# Patient Record
Sex: Female | Born: 1940
Health system: Southern US, Community
[De-identification: ages and names within clinical notes are randomized; demographics above are authoritative.]

## PROBLEM LIST (undated history)

## (undated) DIAGNOSIS — R319 Hematuria, unspecified: Secondary | ICD-10-CM

## (undated) DIAGNOSIS — M8580 Other specified disorders of bone density and structure, unspecified site: Secondary | ICD-10-CM

## (undated) DIAGNOSIS — E785 Hyperlipidemia, unspecified: Secondary | ICD-10-CM

## (undated) DIAGNOSIS — C4490 Unspecified malignant neoplasm of skin, unspecified: Secondary | ICD-10-CM

## (undated) HISTORY — PX: COLONOSCOPY: SHX174

## (undated) HISTORY — PX: ABDOMINOPLASTY: SUR9

---

## 1978-01-14 HISTORY — PX: BUNIONECTOMY: SHX129

## 2002-01-14 HISTORY — PX: AUGMENTATION MAMMAPLASTY: SUR837

## 2007-01-15 HISTORY — PX: FRACTURE SURGERY: SHX138

## 2008-01-20 ENCOUNTER — Ambulatory Visit: Payer: Self-pay | Admitting: Orthopedic Surgery

## 2008-09-13 ENCOUNTER — Ambulatory Visit: Payer: Self-pay | Admitting: Unknown Physician Specialty

## 2008-09-22 ENCOUNTER — Ambulatory Visit: Payer: Self-pay | Admitting: Urology

## 2008-09-29 ENCOUNTER — Ambulatory Visit: Payer: Self-pay | Admitting: Urology

## 2009-01-02 ENCOUNTER — Ambulatory Visit: Payer: Self-pay | Admitting: Podiatry

## 2009-04-04 ENCOUNTER — Ambulatory Visit: Payer: Self-pay | Admitting: Urology

## 2009-10-02 ENCOUNTER — Ambulatory Visit: Payer: Self-pay | Admitting: Urology

## 2010-10-08 ENCOUNTER — Ambulatory Visit: Payer: Self-pay | Admitting: Urology

## 2011-03-06 ENCOUNTER — Ambulatory Visit: Payer: Self-pay | Admitting: Urology

## 2012-02-02 IMAGING — US US RENAL KIDNEY
1 series · 17 of 25 positions shown · non-contrast
Comparison: none

REASON FOR EXAM: angiomyolipoma
COMMENTS:

[Series 1: us renal kidney · 17 of 33 slices shown]
[im 1/33]
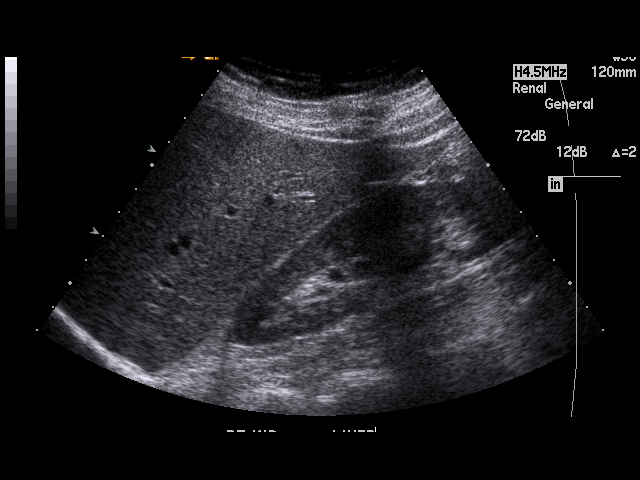
[im 3/33]
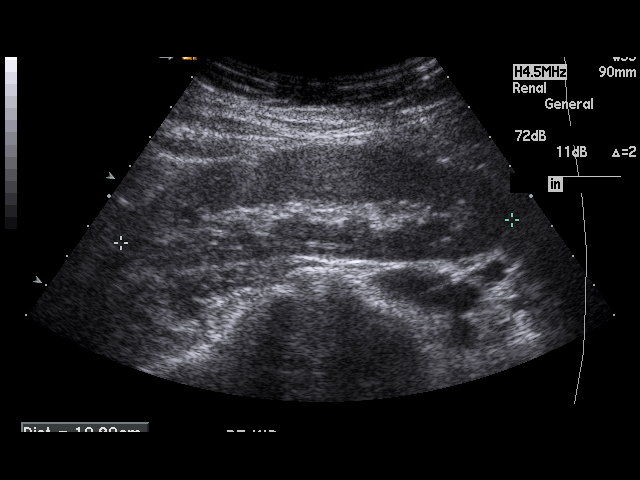
[im 5/33]
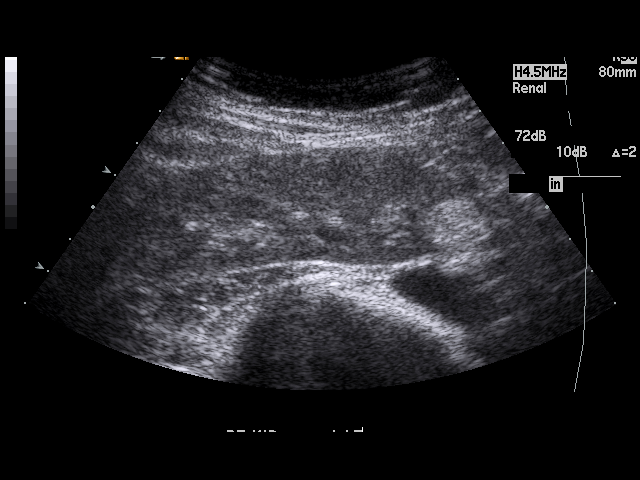
[im 7/33]
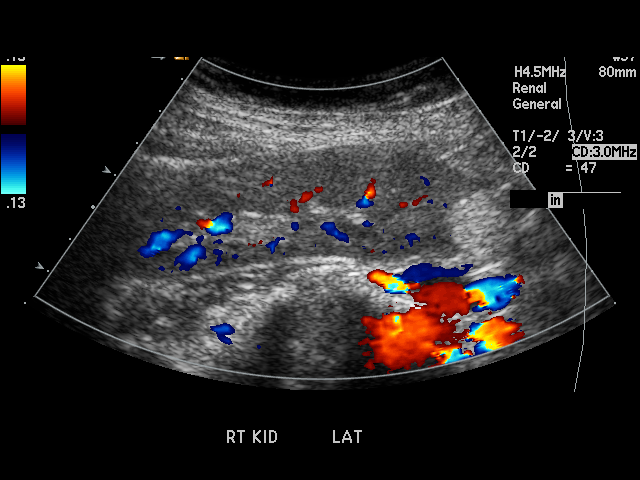
[im 9/33]
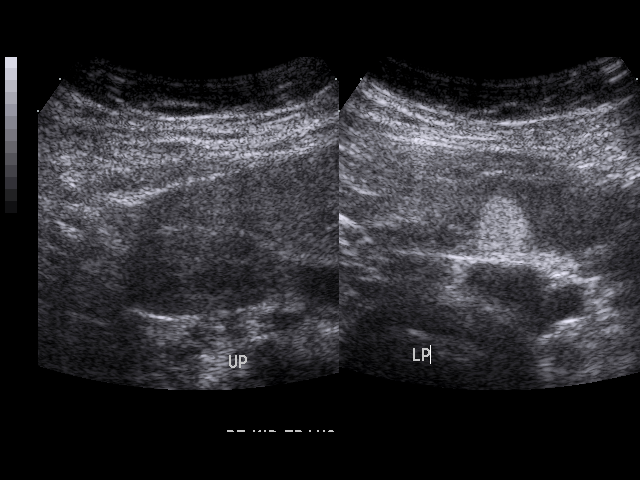
[im 11/33]
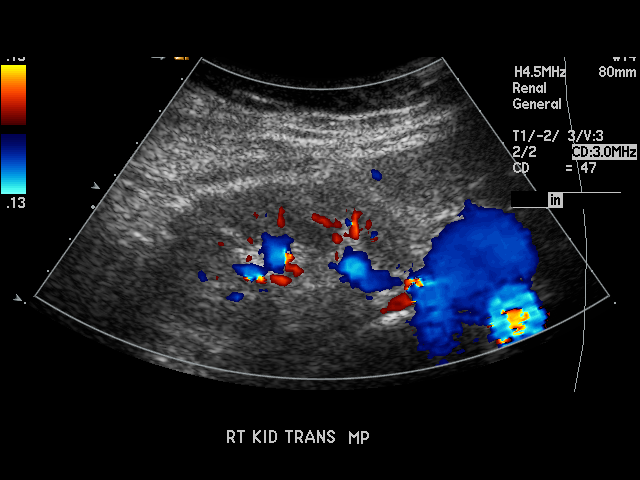
[im 13/33]
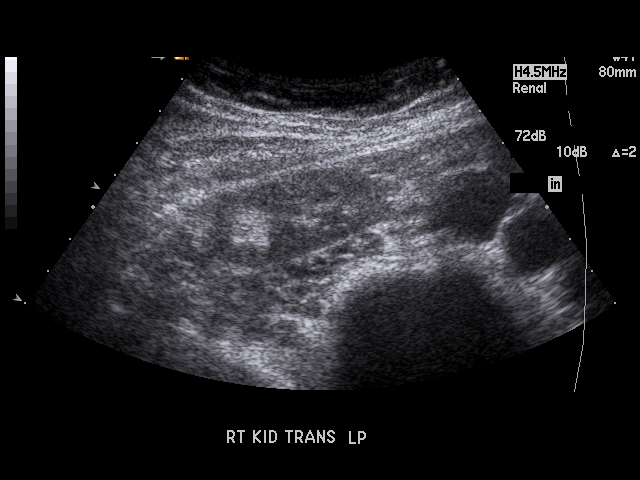
[im 15/33]
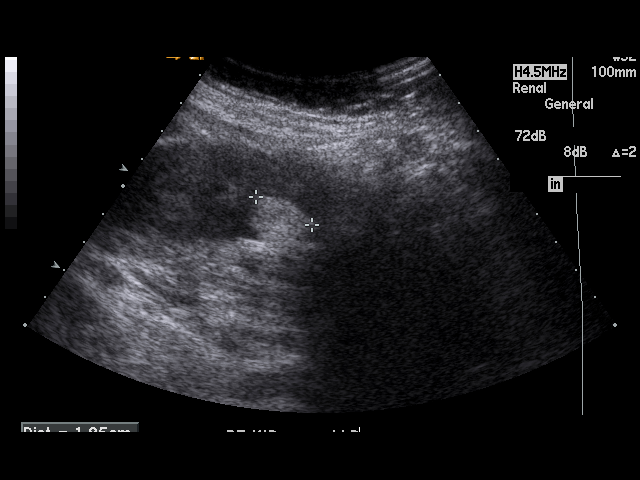
[im 17/33]
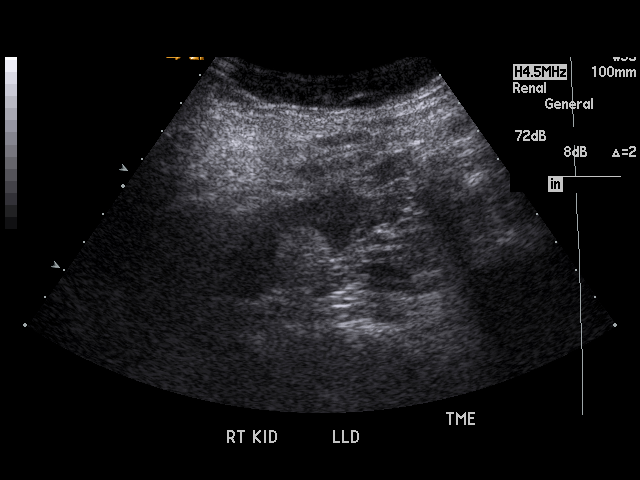
[im 18/33]
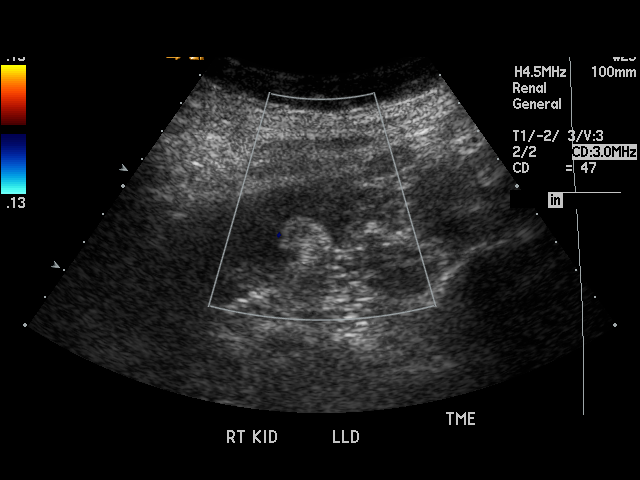
[im 21/33]
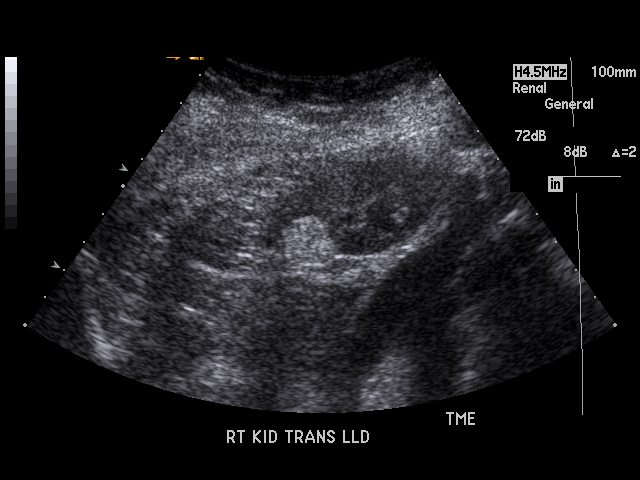
[im 22/33]
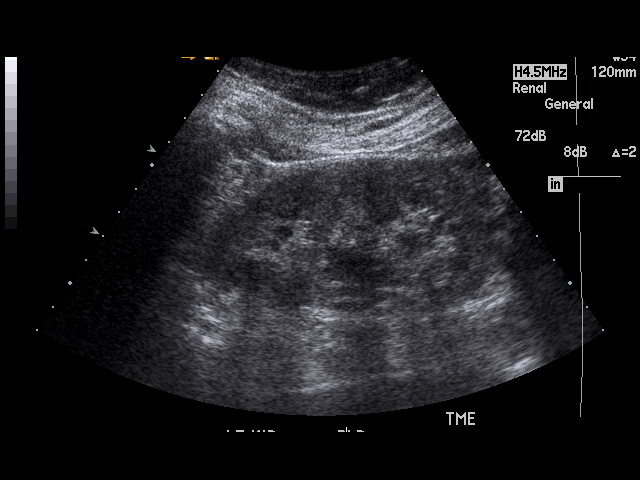
[im 25/33]
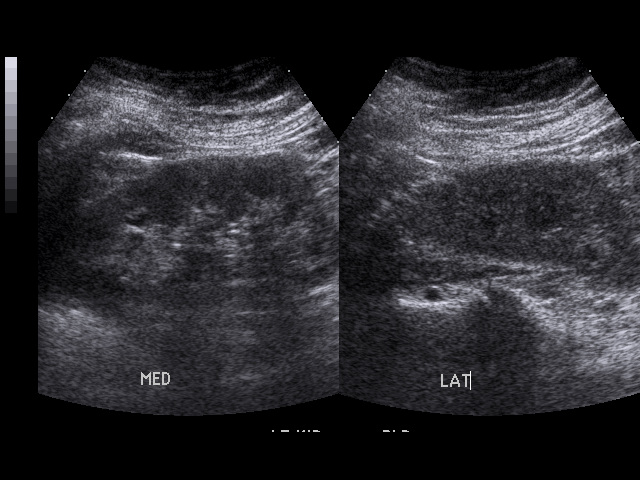
[im 26/33]
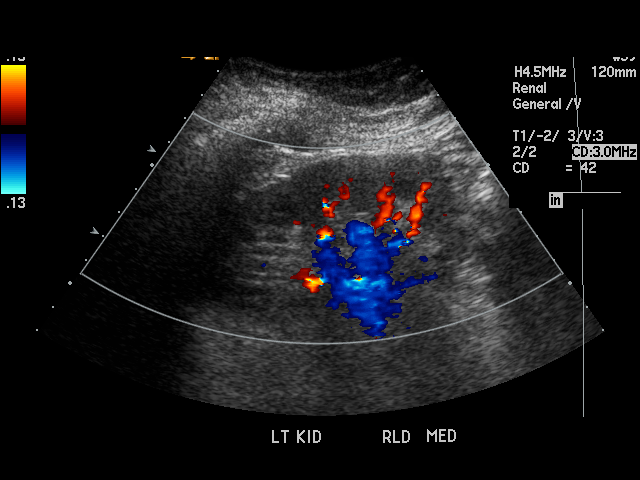
[im 29/33]
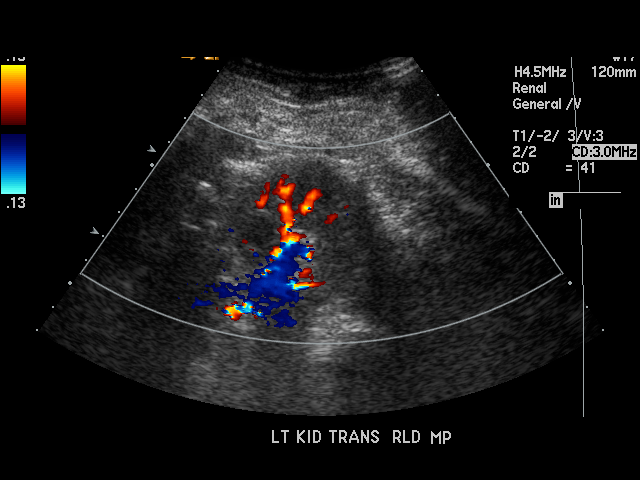
[im 30/33]
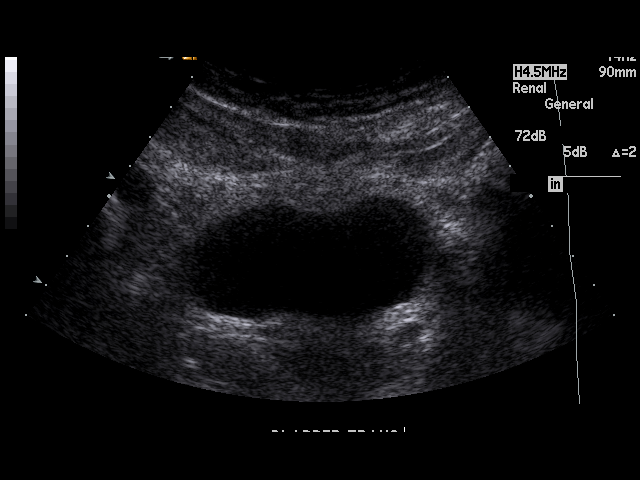
[im 33/33]
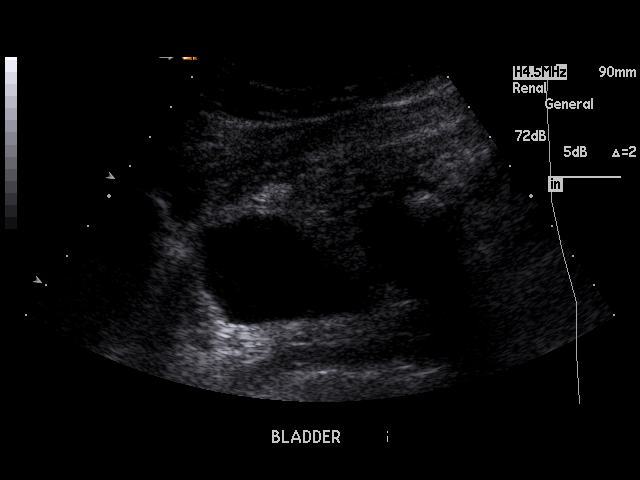

[17 of 25 positions shown; findings below may reference images not displayed]

PROCEDURE:     US  - US KIDNEY  - October 08, 2010  [DATE]

RESULT:     Comparison is made to the prior examinations of 10/02/2009 and
04/04/2009.

There is again noted a hyperechoic mass at the lower pole of the right
kidney. On the current exam, the mass measures 1.85 cm at maximum diameter
which is less than reported on the exam of October 02, 2009 but is similar
in size to the measurement obtained on September 22, 2008. Visually, the mass
appears stable in appearance as compared to the previous examinations and
the variation in measurement is thought to be due to difference in
technique. The finding remains compatible with an angiomyolipoma. No new
renal masses are seen. Renal cortical margins bilaterally are smooth. No
hydronephrosis is observed on either side. The visualized portion of the
urinary bladder is normal in appearance. Bilateral ureteral flow jets are
seen. Sagittally, the right kidney measures 10.8 cm and the left measures
10.9 cm.
IMPRESSION: 1. Stable-appearing hyperechoic mass at the lower pole of the right kidney
consistent with an angiomyolipoma.
2. No new renal masses are seen.
3. No hydronephrosis is observed.

## 2012-03-19 DIAGNOSIS — E559 Vitamin D deficiency, unspecified: Secondary | ICD-10-CM | POA: Insufficient documentation

## 2012-04-08 ENCOUNTER — Ambulatory Visit: Payer: Self-pay | Admitting: Internal Medicine

## 2012-04-08 DIAGNOSIS — Z9289 Personal history of other medical treatment: Secondary | ICD-10-CM | POA: Insufficient documentation

## 2012-04-15 ENCOUNTER — Ambulatory Visit: Payer: Self-pay | Admitting: Internal Medicine

## 2012-06-30 IMAGING — US US RENAL KIDNEY
1 series · 17 of 25 positions shown · non-contrast
Comparison: none

REASON FOR EXAM: Bila Hydronephrosis Seen on Outside US
COMMENTS:

[Series 1: us renal kidney · 17 of 28 slices shown]
[im 1/28]
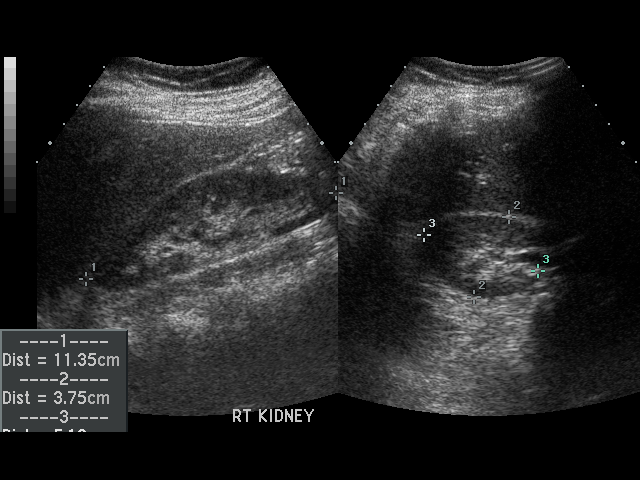
[im 3/28]
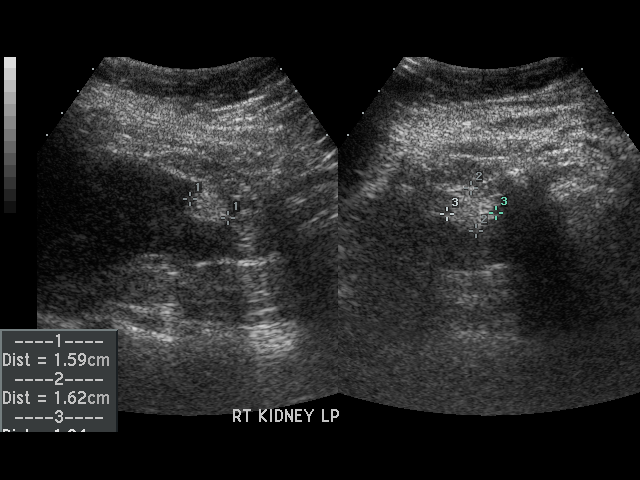
[im 4/28]
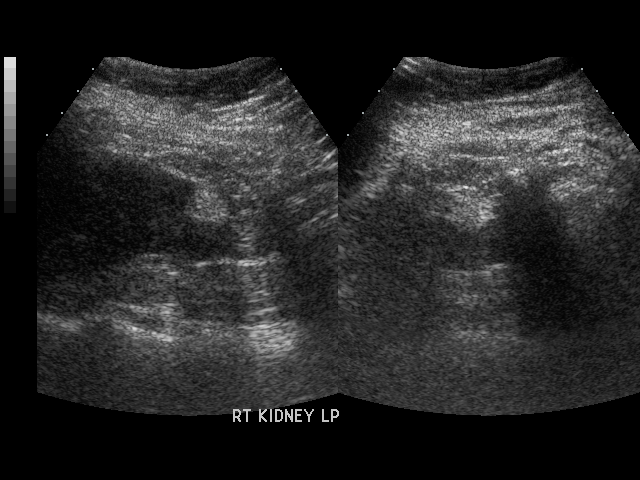
[im 6/28]
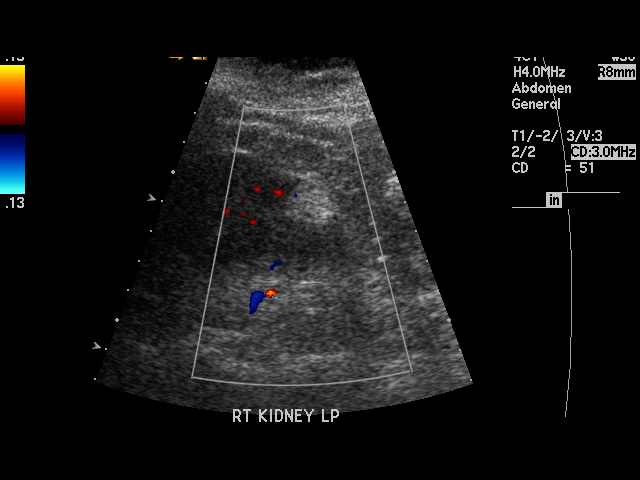
[im 7/28]
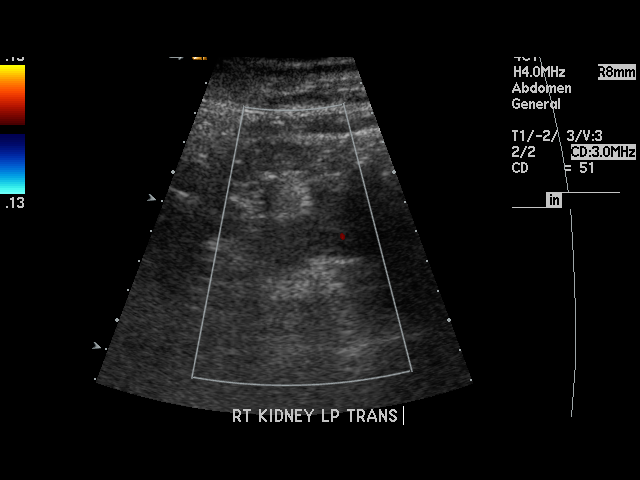
[im 10/28]
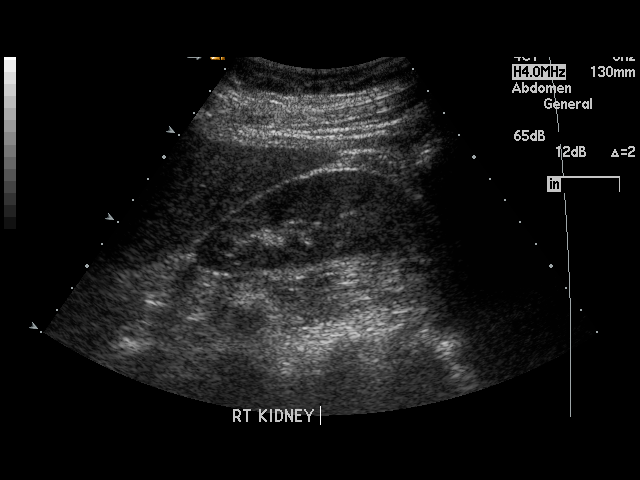
[im 11/28]
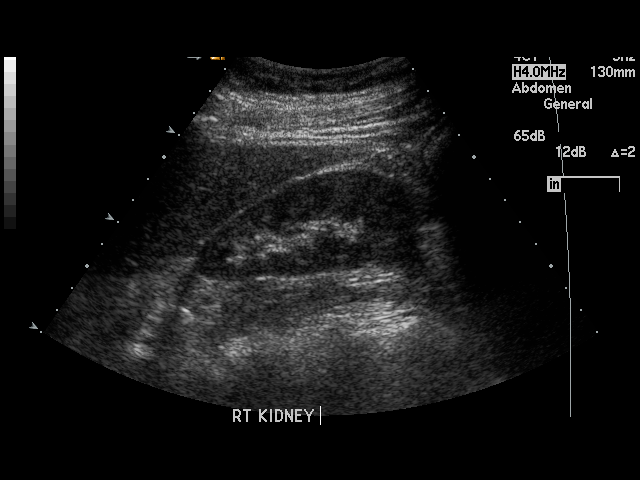
[im 13/28]
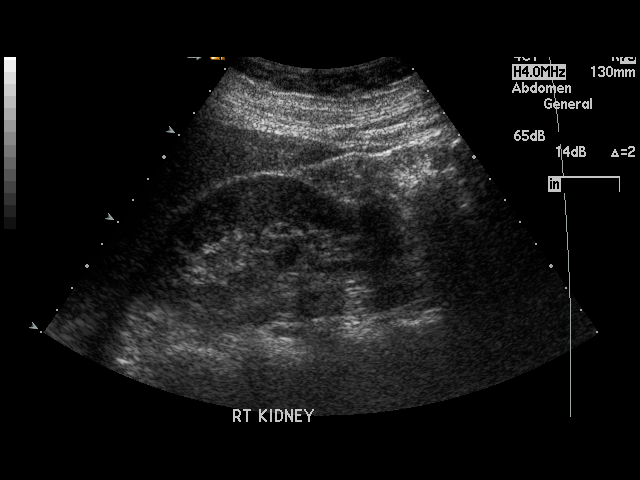
[im 14/28]
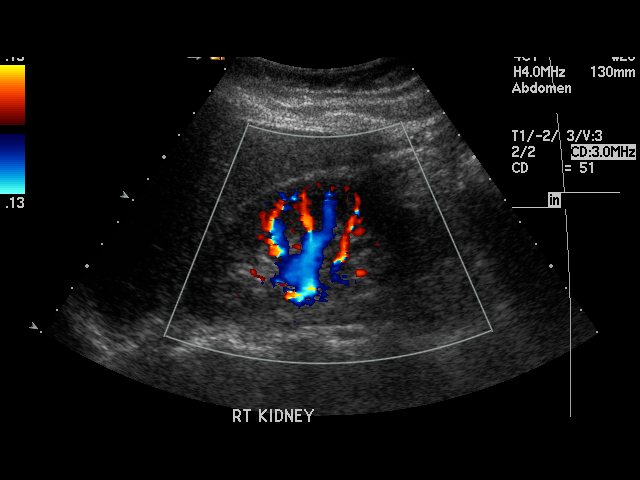
[im 15/28]
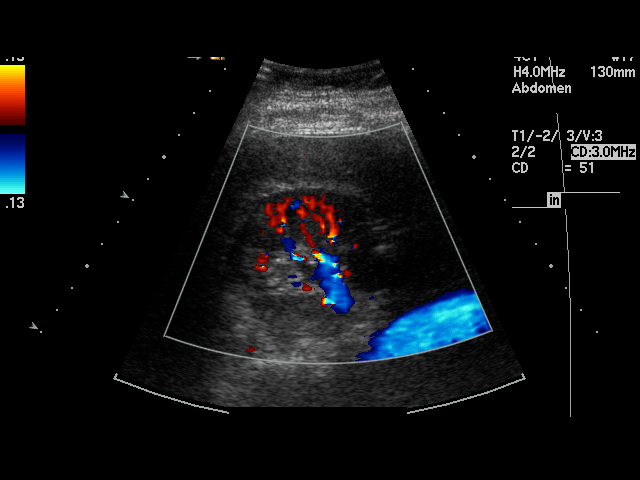
[im 17/28]
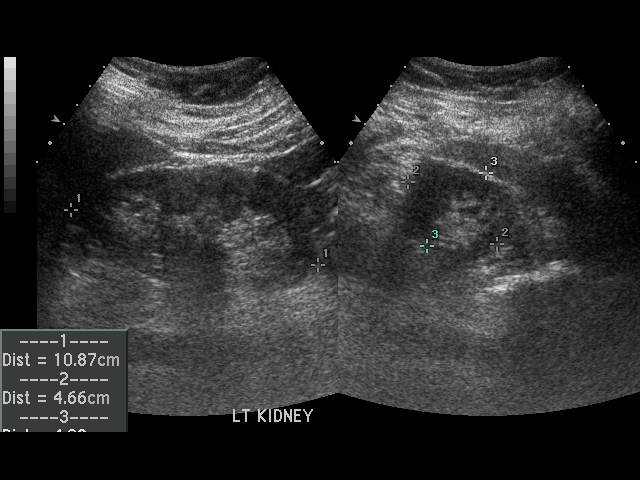
[im 19/28]
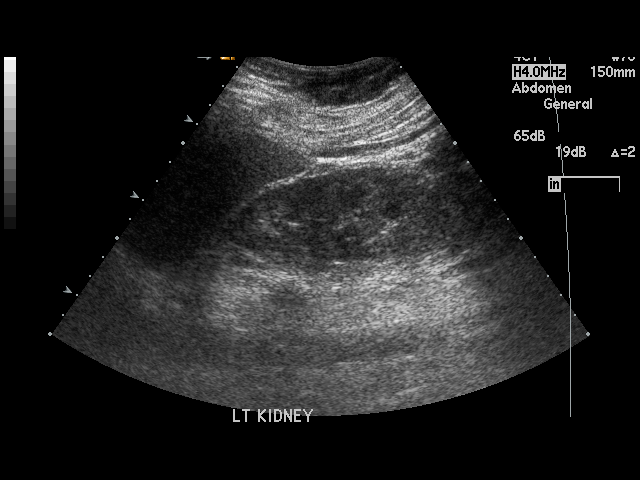
[im 21/28]
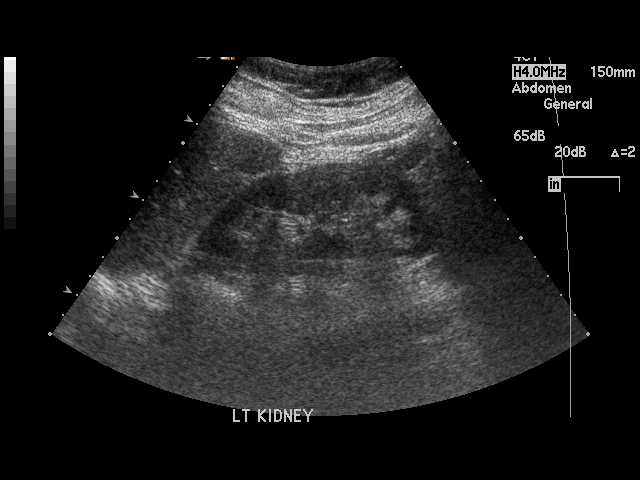
[im 22/28]
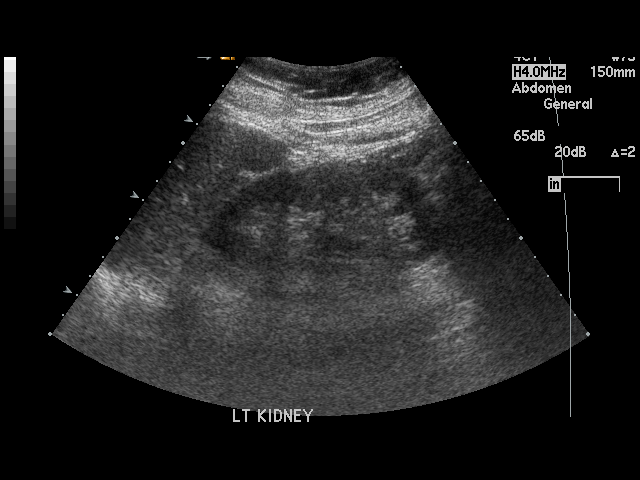
[im 24/28]
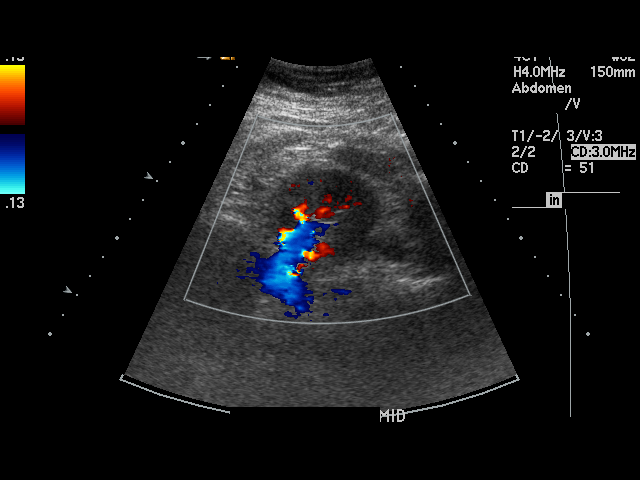
[im 25/28]
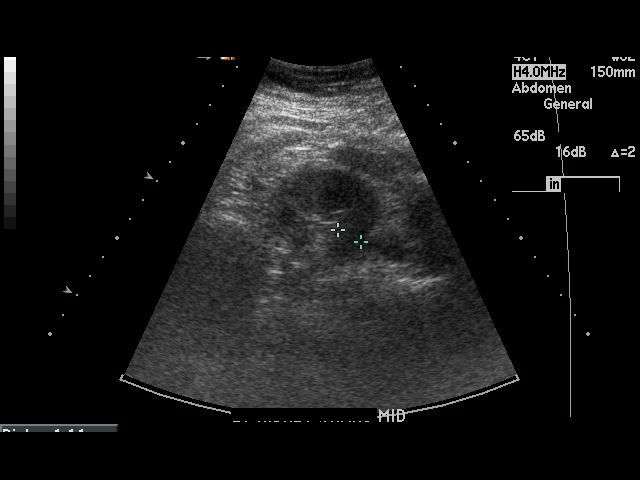
[im 28/28]
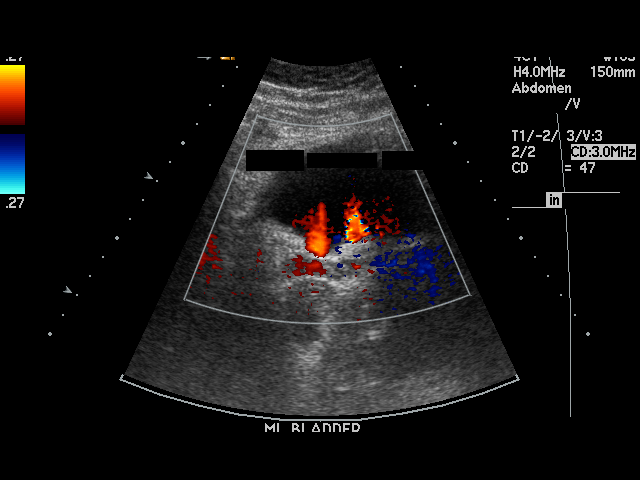

[17 of 25 positions shown; findings below may reference images not displayed]

PROCEDURE:     FELNER - FELNER KIDNEYS  - March 06, 2011  [DATE]

RESULT:

The right kidney measures 11.35 cm x 3.75 cm x 5.13 cm and the left kidney
measures 10.87 cm x 4.66 cm x 4.03 cm. The renal cortical margins
bilaterally are smooth. At the lower pole of the right kidney, there is
observed a hyperechogenic mass measuring 1.84 cm at maximum diameter. The
mass has not appreciably changed in size or configuration as compared to the
prior exam of October 08, 2010. No additional renal mass lesions are seen.
No renal calcifications are noted. There is no hydronephrosis on either
side. The hydronephrosis reportedly previously seen on an outside
examination is not evident at this time. The visualized portion of the
urinary bladder is normal in appearance. Bilateral ureteral flow jets are
seen.
IMPRESSION: 1.  There is a stable, hypoechoic mass at the lower pole of the right
kidney, most compatible with an angiomyolipoma and which measures 1.84 cm at
maximum diameter.
2.  No renal calcifications are seen.
3.  There is no hydronephrosis observed on either side at this time.

## 2013-03-01 DIAGNOSIS — L988 Other specified disorders of the skin and subcutaneous tissue: Secondary | ICD-10-CM | POA: Insufficient documentation

## 2013-03-01 DIAGNOSIS — Z7189 Other specified counseling: Secondary | ICD-10-CM | POA: Insufficient documentation

## 2015-04-14 DIAGNOSIS — K5900 Constipation, unspecified: Secondary | ICD-10-CM | POA: Insufficient documentation

## 2015-06-20 ENCOUNTER — Encounter: Payer: Self-pay | Admitting: *Deleted

## 2015-06-21 ENCOUNTER — Encounter
Admission: RE | Disposition: A | Discharge status: Home / Self Care | Payer: Self-pay | Source: Ambulatory Visit | Attending: Unknown Physician Specialty

## 2015-06-21 ENCOUNTER — Ambulatory Visit: Payer: Medicare PPO | Admitting: Anesthesiology

## 2015-06-21 ENCOUNTER — Ambulatory Visit
Admission: RE | Admit: 2015-06-21 | Discharge: 2015-06-21 | Disposition: A | Discharge status: Home / Self Care | Payer: Medicare PPO | Source: Ambulatory Visit | Attending: Unknown Physician Specialty | Admitting: Unknown Physician Specialty

## 2015-06-21 DIAGNOSIS — K621 Rectal polyp: Secondary | ICD-10-CM | POA: Diagnosis not present

## 2015-06-21 DIAGNOSIS — Z800 Family history of malignant neoplasm of digestive organs: Secondary | ICD-10-CM | POA: Diagnosis not present

## 2015-06-21 DIAGNOSIS — Z85828 Personal history of other malignant neoplasm of skin: Secondary | ICD-10-CM | POA: Insufficient documentation

## 2015-06-21 DIAGNOSIS — K640 First degree hemorrhoids: Secondary | ICD-10-CM | POA: Diagnosis not present

## 2015-06-21 DIAGNOSIS — E785 Hyperlipidemia, unspecified: Secondary | ICD-10-CM | POA: Diagnosis not present

## 2015-06-21 DIAGNOSIS — Z1211 Encounter for screening for malignant neoplasm of colon: Secondary | ICD-10-CM | POA: Insufficient documentation

## 2015-06-21 DIAGNOSIS — K5730 Diverticulosis of large intestine without perforation or abscess without bleeding: Secondary | ICD-10-CM | POA: Insufficient documentation

## 2015-06-21 DIAGNOSIS — M8580 Other specified disorders of bone density and structure, unspecified site: Secondary | ICD-10-CM | POA: Insufficient documentation

## 2015-06-21 HISTORY — DX: Other specified disorders of bone density and structure, unspecified site: M85.80

## 2015-06-21 HISTORY — DX: Hyperlipidemia, unspecified: E78.5

## 2015-06-21 HISTORY — DX: Unspecified malignant neoplasm of skin, unspecified: C44.90

## 2015-06-21 HISTORY — PX: COLONOSCOPY WITH PROPOFOL: SHX5780

## 2015-06-21 HISTORY — DX: Hematuria, unspecified: R31.9

## 2015-06-21 SURGERY — COLONOSCOPY WITH PROPOFOL
Anesthesia: General

## 2015-06-21 MED ORDER — SODIUM CHLORIDE 0.9 % IV SOLN
INTRAVENOUS | Status: DC
  Administered 2015-06-21: 1000 mL via INTRAVENOUS

## 2015-06-21 MED ORDER — SODIUM CHLORIDE 0.9 % IV SOLN: INTRAVENOUS | Status: DC

## 2015-06-21 MED ORDER — PROPOFOL 500 MG/50ML IV EMUL
INTRAVENOUS | Status: DC | PRN
  Administered 2015-06-21: 150 ug/kg/min via INTRAVENOUS

## 2015-06-21 MED ORDER — PHENYLEPHRINE HCL 10 MG/ML IJ SOLN
INTRAMUSCULAR | Status: DC | PRN
  Administered 2015-06-21 (×5): 100 ug via INTRAVENOUS

## 2015-06-21 MED ORDER — PROPOFOL 10 MG/ML IV BOLUS
INTRAVENOUS | Status: DC | PRN
  Administered 2015-06-21: 50 mg via INTRAVENOUS

## 2015-06-21 MED ORDER — FENTANYL CITRATE (PF) 100 MCG/2ML IJ SOLN
INTRAMUSCULAR | Status: DC | PRN
  Administered 2015-06-21: 50 ug via INTRAVENOUS

## 2015-06-21 NOTE — Transfer of Care (Signed)
Immediate Anesthesia Transfer of Care Note  Patient: KITT COURSE  Procedure(s) Performed: Procedure(s): COLONOSCOPY WITH PROPOFOL (N/A)  Patient Location: PACU  Anesthesia Type:General  Level of Consciousness: awake, alert , oriented and patient cooperative  Airway & Oxygen Therapy: Patient Spontanous Breathing and Patient connected to nasal cannula oxygen  Post-op Assessment: Report given to RN, Post -op Vital signs reviewed and stable and Patient moving all extremities  Post vital signs: Reviewed and stable  Last Vitals:  Filed Vitals:   06/21/15 0839 06/21/15 0945  BP: 139/71 95/45  Pulse: 74 68  Temp: 36 C 35.9 C  Resp: 18 11    Last Pain: There were no vitals filed for this visit.       Complications: No apparent anesthesia complications

## 2015-06-21 NOTE — Op Note (Signed)
[facility] Gastroenterology Patient Name: Morgan Oneill Procedure Date: 06/21/2015 9:11 AM MRN: [id number] Account #: [id number] Date of Birth: 1940-07-06 Admit Type: Outpatient Age: 75 Room: [facility] ENDO ROOM 4 Gender: Female Note Status: Finalized Procedure:            Colonoscopy Indications:          Screening in patient at increased risk: Family history                        of 1st-degree relative with colorectal cancer Providers:            Manya Silvas, MD Referring MD:         No Local Md, MD (Referring MD) Medicines:            Propofol per Anesthesia Complications:        No immediate complications. Procedure:            Pre-Anesthesia Assessment:                       - After reviewing the risks and benefits, the patient                        was deemed in satisfactory condition to undergo the                        procedure.                       After obtaining informed consent, the colonoscope was                        passed under direct vision. Throughout the procedure,                        the patient's blood pressure, pulse, and oxygen                        saturations were monitored continuously. The                        Colonoscope was introduced through the anus and                        advanced to the the cecum, identified by appendiceal                        orifice and ileocecal valve. The colonoscopy was                        performed without difficulty. The patient tolerated the                        procedure well. The quality of the bowel preparation                        was excellent. Findings:      A few small-mouthed diverticula were found in the sigmoid colon.      A diminutive polyp was found in the rectum. The polyp was sessile. The       polyp was removed with a jumbo cold forceps. Resection and  retrieval       were complete.      Internal hemorrhoids were found during endoscopy. The hemorrhoids were   small and Grade I (internal hemorrhoids that do not prolapse).      The exam was otherwise without abnormality. Impression:           - Diverticulosis in the sigmoid colon.                       - One diminutive polyp in the rectum, removed with a                        jumbo cold forceps. Resected and retrieved.                       - Internal hemorrhoids.                       - The examination was otherwise normal. Recommendation:       - Await pathology results. Manya Silvas, MD 06/21/2015 9:44:47 AM This report has been signed electronically. Number of Addenda: 0 Note Initiated On: 06/21/2015 9:11 AM Scope Withdrawal Time: 0 hours 14 minutes 10 seconds  Total Procedure Duration: 0 hours 23 minutes 25 seconds       [facility]

## 2015-06-21 NOTE — Anesthesia Postprocedure Evaluation (Signed)
Anesthesia Post Note  Patient: Morgan Oneill  Procedure(s) Performed: Procedure(s) (LRB): COLONOSCOPY WITH PROPOFOL (N/A)  Patient location during evaluation: PACU Anesthesia Type: General Level of consciousness: awake and alert Pain management: pain level controlled Vital Signs Assessment: post-procedure vital signs reviewed and stable Respiratory status: spontaneous breathing, nonlabored ventilation, respiratory function stable and patient connected to nasal cannula oxygen Cardiovascular status: blood pressure returned to baseline and stable Postop Assessment: no signs of nausea or vomiting Anesthetic complications: no    Last Vitals:  Filed Vitals:   06/21/15 1005 06/21/15 1015  BP: 107/59 112/57  Pulse: 66 60  Temp:    Resp: 17 12    Last Pain: There were no vitals filed for this visit.               Martha Clan

## 2015-06-21 NOTE — Anesthesia Preprocedure Evaluation (Signed)
Anesthesia Evaluation  Patient identified by MRN, date of birth, ID band Patient awake    Reviewed: Allergy & Precautions, H&P , NPO status , Patient's Chart, lab work & pertinent test results, reviewed documented beta blocker date and time   History of Anesthesia Complications Negative for: history of anesthetic complications  Airway Mallampati: II  TM Distance: >3 FB Neck ROM: full    Dental no notable dental hx. (+) Caps, Teeth Intact Veneers on the top:   Pulmonary neg pulmonary ROS,    Pulmonary exam normal breath sounds clear to auscultation       Cardiovascular Exercise Tolerance: Good negative cardio ROS Normal cardiovascular exam Rhythm:regular Rate:Normal     Neuro/Psych negative neurological ROS  negative psych ROS   GI/Hepatic negative GI ROS, Neg liver ROS,   Endo/Other  negative endocrine ROS  Renal/GU negative Renal ROS  negative genitourinary   Musculoskeletal   Abdominal   Peds  Hematology negative hematology ROS (+)   Anesthesia Other Findings Past Medical History:   Skin cancer                                                  Hematuria                                                    Hyperlipidemia                                               Osteopenia                                                   Reproductive/Obstetrics negative OB ROS                             Anesthesia Physical Anesthesia Plan  ASA: II  Anesthesia Plan: General   Post-op Pain Management:    Induction:   Airway Management Planned:   Additional Equipment:   Intra-op Plan:   Post-operative Plan:   Informed Consent: I have reviewed the patients History and Physical, chart, labs and discussed the procedure including the risks, benefits and alternatives for the proposed anesthesia with the patient or authorized representative who has indicated his/her understanding and  acceptance.   Dental Advisory Given  Plan Discussed with: Anesthesiologist, CRNA and Surgeon  Anesthesia Plan Comments:         Anesthesia Quick Evaluation

## 2015-06-21 NOTE — H&P (Signed)
   Primary Care Physician:  No primary care provider on file. Primary Gastroenterologist:  Dr. Vira Agar  Pre-Procedure History & Physical: HPI:  Morgan Oneill is a 75 y.o. female is here for an colonoscopy.   Past Medical History  Diagnosis Date  . Skin cancer   . Hematuria   . Hyperlipidemia   . Osteopenia     Past Surgical History  Procedure Laterality Date  . Bunionectomy Bilateral 1980  . Fracture surgery Left 2009  . Abdominoplasty    . Colonoscopy      Prior to Admission medications   Not on File    Allergies as of 05/04/2015  . (Not on File)    History reviewed. No pertinent family history.  Social History   Social History  . Marital Status: Married    Spouse Name: N/A  . Number of Children: N/A  . Years of Education: N/A   Occupational History  . Not on file.   Social History Main Topics  . Smoking status: Never Smoker   . Smokeless tobacco: Never Used  . Alcohol Use: No  . Drug Use: No  . Sexual Activity: Not on file   Other Topics Concern  . Not on file   Social History Narrative    Review of Systems: See HPI, otherwise negative ROS  Physical Exam: BP 139/71 mmHg  Pulse 74  Temp(Src) 96.8 F (36 C) (Tympanic)  Resp 18  Ht 5\' 7"  (1.702 m)  Wt 71.215 kg (157 lb)  BMI 24.58 kg/m2  SpO2 100% General:   Alert,  pleasant and cooperative in NAD Head:  Normocephalic and atraumatic. Neck:  Supple; no masses or thyromegaly. Lungs:  Clear throughout to auscultation.    Heart:  Regular rate and rhythm. Abdomen:  Soft, nontender and nondistended. Normal bowel sounds, without guarding, and without rebound.   Neurologic:  Alert and  oriented x4;  grossly normal neurologically.  Impression/Plan: Morgan Oneill is here for an colonoscopy to be performed for screening for FH colon cancer in brother  Risks, benefits, limitations, and alternatives regarding  colonoscopy have been reviewed with the patient.  Questions have been answered.  All  parties agreeable.   Gaylyn Cheers, MD  06/21/2015, 9:10 AM

## 2015-06-22 ENCOUNTER — Encounter: Payer: Self-pay | Admitting: Unknown Physician Specialty

## 2015-06-22 LAB — SURGICAL PATHOLOGY

## 2016-02-12 DIAGNOSIS — J0100 Acute maxillary sinusitis, unspecified: Secondary | ICD-10-CM | POA: Diagnosis not present

## 2016-04-25 DIAGNOSIS — M2242 Chondromalacia patellae, left knee: Secondary | ICD-10-CM | POA: Diagnosis not present

## 2016-05-21 DIAGNOSIS — M2242 Chondromalacia patellae, left knee: Secondary | ICD-10-CM | POA: Diagnosis not present

## 2017-02-02 DIAGNOSIS — J069 Acute upper respiratory infection, unspecified: Secondary | ICD-10-CM | POA: Diagnosis not present

## 2017-04-17 DIAGNOSIS — D2271 Melanocytic nevi of right lower limb, including hip: Secondary | ICD-10-CM | POA: Diagnosis not present

## 2017-04-17 DIAGNOSIS — Z85828 Personal history of other malignant neoplasm of skin: Secondary | ICD-10-CM | POA: Diagnosis not present

## 2017-04-17 DIAGNOSIS — L820 Inflamed seborrheic keratosis: Secondary | ICD-10-CM | POA: Diagnosis not present

## 2017-04-17 DIAGNOSIS — Z08 Encounter for follow-up examination after completed treatment for malignant neoplasm: Secondary | ICD-10-CM | POA: Diagnosis not present

## 2017-04-17 DIAGNOSIS — D2261 Melanocytic nevi of right upper limb, including shoulder: Secondary | ICD-10-CM | POA: Diagnosis not present

## 2017-04-17 DIAGNOSIS — D2272 Melanocytic nevi of left lower limb, including hip: Secondary | ICD-10-CM | POA: Diagnosis not present

## 2017-04-17 DIAGNOSIS — C4362 Malignant melanoma of left upper limb, including shoulder: Secondary | ICD-10-CM | POA: Diagnosis not present

## 2017-04-17 DIAGNOSIS — D485 Neoplasm of uncertain behavior of skin: Secondary | ICD-10-CM | POA: Diagnosis not present

## 2017-04-17 DIAGNOSIS — D2262 Melanocytic nevi of left upper limb, including shoulder: Secondary | ICD-10-CM | POA: Diagnosis not present

## 2017-04-22 ENCOUNTER — Encounter: Payer: Self-pay | Admitting: Physician Assistant

## 2017-05-09 ENCOUNTER — Ambulatory Visit (INDEPENDENT_AMBULATORY_CARE_PROVIDER_SITE_OTHER): Payer: PPO

## 2017-05-09 ENCOUNTER — Ambulatory Visit (INDEPENDENT_AMBULATORY_CARE_PROVIDER_SITE_OTHER): Payer: PPO | Admitting: Physician Assistant

## 2017-05-09 ENCOUNTER — Encounter: Payer: Self-pay | Admitting: Physician Assistant

## 2017-05-09 VITALS — BP 134/56 | HR 64 | Temp 97.7°F | Wt 147.4 lb

## 2017-05-09 DIAGNOSIS — Z2821 Immunization not carried out because of patient refusal: Secondary | ICD-10-CM | POA: Diagnosis not present

## 2017-05-09 DIAGNOSIS — Z1239 Encounter for other screening for malignant neoplasm of breast: Secondary | ICD-10-CM

## 2017-05-09 DIAGNOSIS — Z124 Encounter for screening for malignant neoplasm of cervix: Secondary | ICD-10-CM | POA: Diagnosis not present

## 2017-05-09 DIAGNOSIS — Z800 Family history of malignant neoplasm of digestive organs: Secondary | ICD-10-CM | POA: Diagnosis not present

## 2017-05-09 DIAGNOSIS — Z01419 Encounter for gynecological examination (general) (routine) without abnormal findings: Secondary | ICD-10-CM

## 2017-05-09 DIAGNOSIS — R87618 Other abnormal cytological findings on specimens from cervix uteri: Secondary | ICD-10-CM

## 2017-05-09 DIAGNOSIS — M8580 Other specified disorders of bone density and structure, unspecified site: Secondary | ICD-10-CM | POA: Insufficient documentation

## 2017-05-09 DIAGNOSIS — Z0000 Encounter for general adult medical examination without abnormal findings: Secondary | ICD-10-CM

## 2017-05-09 DIAGNOSIS — Z1231 Encounter for screening mammogram for malignant neoplasm of breast: Secondary | ICD-10-CM | POA: Diagnosis not present

## 2017-05-09 DIAGNOSIS — R8789 Other abnormal findings in specimens from female genital organs: Secondary | ICD-10-CM | POA: Diagnosis not present

## 2017-05-09 DIAGNOSIS — E7800 Pure hypercholesterolemia, unspecified: Secondary | ICD-10-CM

## 2017-05-09 DIAGNOSIS — E785 Hyperlipidemia, unspecified: Secondary | ICD-10-CM | POA: Insufficient documentation

## 2017-05-09 DIAGNOSIS — M810 Age-related osteoporosis without current pathological fracture: Secondary | ICD-10-CM

## 2017-05-09 NOTE — Progress Notes (Signed)
Patient: Morgan Oneill, Female    DOB: [birth year], 77 y.o.   MRN: [id number] Visit Date: 05/09/2017  Today's Provider: Mar Daring, PA-C   No chief complaint on file.  Subjective:  Patient is also establishing care, and saw Mckenzie today at [facility] for AWV.   Morgan Oneill is a 77 y.o. female. She feels well. She reports exercising occasionally. She reports she is sleeping well. She is establishing from Dr. Marella Bile office. She has not been seen since 2013. She reports in 2013 she had a pap and was told she was HPV positive but never has had any follow up. She denies any issue, vaginal bleeding, abdominal pain, etc.   She had colonoscopy in 2017 with Dr. Vira Agar. Diverticulosis noted and one hyperplastic polyp. Repeat in 10 years if desired. She does have family history of colon cancer in a brother at age 10.   She does have history of angiomyolipoma that has caused microscopic hematuria and was previously followed by Dr. Bernardo Heater. No complaints recently.   She does have known osteoporosis with T score -3.0 on most recent bone density in 2014. She declines treatments. She is taking calcium+vit d and walks daily.   Most recent mammogram was in 2014 and was birads 2. She does have breast implants. She declines mammogram this year.   Review of Systems  Constitutional: Negative.   HENT: Negative.   Eyes: Negative.   Respiratory: Negative.   Cardiovascular: Negative.   Gastrointestinal: Negative.   Endocrine: Negative.   Genitourinary: Negative.   Musculoskeletal: Negative.   Skin: Negative.   Allergic/Immunologic: Negative.   Neurological: Negative.   Hematological: Negative.   Psychiatric/Behavioral: Negative.     Social History   Socioeconomic History  . Marital status: Married    Spouse name: Not on file  . Number of children: 1  . Years of education: Not on file  . Highest education level: GED or equivalent  Occupational History  . Occupation: alterations    Social Needs  . Financial resource strain: Not hard at all  . Food insecurity:    Worry: Never true    Inability: Never true  . Transportation needs:    Medical: No    Non-medical: No  Tobacco Use  . Smoking status: Never Smoker  . Smokeless tobacco: Never Used  Substance and Sexual Activity  . Alcohol use: No  . Drug use: No  . Sexual activity: Not on file  Lifestyle  . Physical activity:    Days per week: Not on file    Minutes per session: Not on file  . Stress: Not at all  Relationships  . Social connections:    Talks on phone: Not on file    Gets together: Not on file    Attends religious service: Not on file    Active member of club or organization: Not on file    Attends meetings of clubs or organizations: Not on file    Relationship status: Not on file  . Intimate partner violence:    Fear of current or ex partner: Not on file    Emotionally abused: Not on file    Physically abused: Not on file    Forced sexual activity: Not on file  Other Topics Concern  . Not on file  Social History Narrative  . Not on file    Past Medical History:  Diagnosis Date  . Hematuria   . Hyperlipidemia   . Osteopenia   .  Skin cancer      There are no active problems to display for this patient.   Past Surgical History:  Procedure Laterality Date  . ABDOMINOPLASTY    . BUNIONECTOMY Bilateral 1980  . COLONOSCOPY    . COLONOSCOPY WITH PROPOFOL N/A 06/21/2015   Procedure: COLONOSCOPY WITH PROPOFOL;  Surgeon: Manya Silvas, MD;  [facility];  Service: Endoscopy;  Laterality: N/A;  . FRACTURE SURGERY Left 2009    Her family history includes Cancer in her father; Colon cancer in her brother.     No current outpatient medications on file.  Patient Care Team: Mar Daring, PA-C as PCP - General (Family Medicine)     Objective:    Vitals: BP 134/56 (BP Location: Left Arm)    Pulse 64    Temp 97.7 F (36.5 C) (Oral)    Wt 147 lb 6.4 oz  (66.9 kg)    BMI 23.09 kg/m    BSA 1.78 m  Physical Exam  Constitutional: She is oriented to person, place, and time. She appears well-developed and well-nourished. No distress.  HENT:  Head: Normocephalic and atraumatic.  Right Ear: Hearing, tympanic membrane, external ear and ear canal normal.  Left Ear: Hearing, tympanic membrane, external ear and ear canal normal.  Nose: Nose normal.  Mouth/Throat: Uvula is midline, oropharynx is clear and moist and mucous membranes are normal. No oropharyngeal exudate.  Eyes: Pupils are equal, round, and reactive to light. Conjunctivae and EOM are normal. Right eye exhibits no discharge. Left eye exhibits no discharge. No scleral icterus.  Neck: Normal range of motion. Neck supple. No JVD present. Carotid bruit is not present. No tracheal deviation present. No thyromegaly present.  Cardiovascular: Normal rate, regular rhythm, normal heart sounds and intact distal pulses. Exam reveals no gallop and no friction rub.  No murmur heard. Pulmonary/Chest: Effort normal and breath sounds normal. No respiratory distress. She has no wheezes. She has no rales. She exhibits no tenderness. Right breast exhibits no inverted nipple, no mass, no nipple discharge, no skin change and no tenderness. Left breast exhibits no inverted nipple, no mass, no nipple discharge, no skin change and no tenderness. No breast tenderness, discharge or bleeding. Breasts are symmetrical.  Abdominal: Soft. Bowel sounds are normal. She exhibits no distension and no mass. There is no tenderness. There is no rebound and no guarding. Hernia confirmed negative in the right inguinal area and confirmed negative in the left inguinal area.  Genitourinary: Rectum normal, vagina normal and uterus normal. No breast tenderness, discharge or bleeding. Pelvic exam was performed with patient supine. There is no rash, tenderness, lesion or injury on the right labia. There is no rash, tenderness, lesion or  injury on the left labia. Cervix exhibits no motion tenderness, no discharge and no friability. Right adnexum displays no mass, no tenderness and no fullness. Left adnexum displays no mass, no tenderness and no fullness. No erythema, tenderness or bleeding in the vagina. No signs of injury around the vagina. No vaginal discharge found.  Musculoskeletal: Normal range of motion. She exhibits no edema or tenderness.  Lymphadenopathy:    She has no cervical adenopathy.       Right: No inguinal adenopathy present.       Left: No inguinal adenopathy present.  Neurological: She is alert and oriented to person, place, and time. She has normal reflexes. No cranial nerve deficit. Coordination normal.  Skin: Skin is warm and dry. No rash noted. She is not diaphoretic.  Psychiatric: She has a normal mood and affect. Her behavior is normal. Judgment and thought content normal.  Vitals reviewed.   Activities of Daily Living In your present state of health, do you have any difficulty performing the following activities: 05/09/2017  Hearing? N  Vision? N  Difficulty concentrating or making decisions? N  Walking or climbing stairs? N  Dressing or bathing? N  Doing errands, shopping? N  Preparing Food and eating ? N  Using the Toilet? N  In the past six months, have you accidently leaked urine? Y  Comment Occasionally, wears protection.  Do you have problems with loss of bowel control? N  Managing your Medications? N  Managing your Finances? N  Housekeeping or managing your Housekeeping? N  Some recent data might be hidden    Fall Risk Assessment Fall Risk  05/09/2017  Falls in the past year? No     Depression Screen PHQ 2/9 Scores 05/09/2017 05/09/2017  PHQ - 2 Score 0 0  PHQ- 9 Score 0 -    Cognitive Testing - 6-CIT  Cognitive Function: Pt declined screening today.   Assessment & Plan:     Annual Wellness Visit  Reviewed patient's Family Medical History Reviewed and updated list of  patient's medical providers Assessment of cognitive impairment was done Assessed patient's functional ability Established a written schedule for health screening [place] Completed and Reviewed  Exercise Activities and Dietary recommendations Goals    . DIET - INCREASE WATER INTAKE     Recommend increasing water intake to 4 glasses a day.        Immunization History  Administered Date(s) Administered  . Pneumococcal Polysaccharide-23 03/11/2012  . Tdap 01/15/2011  . Zoster 03/11/2012    Health Maintenance  Topic Date Due  . MAMMOGRAM  11/17/1992  . PNA vac Low Risk Adult (2 of 2 - PCV13) 03/11/2013  . INFLUENZA VACCINE  08/14/2017  . TETANUS/TDAP  01/14/2021  . COLONOSCOPY  06/20/2025  . DEXA SCAN  Completed     Discussed health benefits of physical activity, and encouraged her to engage in regular exercise appropriate for her age and condition.   1. Pap smear abnormality of cervix/human papillomavirus (HPV) positive Most recent pap in 2013 was HPV positive per patient. Pap collected today. Will send as below and f/u pending results. - Pap IG and HPV (high risk) DNA detection  2. Cervical cancer screening Pap collected today. Will send as below and f/u pending results. - Pap IG and HPV (high risk) DNA detection  3. Age-related osteoporosis without current pathological fracture Will check labs as below and f/u pending results. Postmenopausal. Patient declines treatment.  - CBC w/Diff/Platelet - Comprehensive Metabolic Panel (CMET) - TSH  4. Pure hypercholesterolemia H/O this. Diet controlled. Will check labs as below and f/u pending results. - CBC w/Diff/Platelet - Comprehensive Metabolic Panel (CMET) - Lipid Profile - TSH  5. Family history of colon cancer In brother, age 81. Diagnosed 2015.  6. Breast cancer screening Patient declines.   7. Pneumococcal vaccination declined by patient     Mar Daring, PA-C  [facility]

## 2017-05-09 NOTE — Patient Instructions (Signed)
Health Maintenance for Postmenopausal Women Menopause is a normal process in which your reproductive ability comes to an end. This process happens gradually over a span of months to years, usually between the ages of 22 and 9. Menopause is complete when you have missed 12 consecutive menstrual periods. It is important to talk with your health care provider about some of the most common conditions that affect postmenopausal women, such as heart disease, cancer, and bone loss (osteoporosis). Adopting a healthy lifestyle and getting preventive care can help to promote your health and wellness. Those actions can also lower your chances of developing some of these common conditions. What should I know about menopause? During menopause, you may experience a number of symptoms, such as:  Moderate-to-severe hot flashes.  Night sweats.  Decrease in sex drive.  Mood swings.  Headaches.  Tiredness.  Irritability.  Memory problems.  Insomnia.  Choosing to treat or not to treat menopausal changes is an individual decision that you make with your health care provider. What should I know about hormone replacement therapy and supplements? Hormone therapy products are effective for treating symptoms that are associated with menopause, such as hot flashes and night sweats. Hormone replacement carries certain risks, especially as you become older. If you are thinking about using estrogen or estrogen with progestin treatments, discuss the benefits and risks with your health care provider. What should I know about heart disease and stroke? Heart disease, heart attack, and stroke become more likely as you age. This may be due, in part, to the hormonal changes that your body experiences during menopause. These can affect how your body processes dietary fats, triglycerides, and cholesterol. Heart attack and stroke are both medical emergencies. There are many things that you can do to help prevent heart disease  and stroke:  Have your blood pressure checked at least every 1-2 years. High blood pressure causes heart disease and increases the risk of stroke.  If you are 53-22 years old, ask your health care provider if you should take aspirin to prevent a heart attack or a stroke.  Do not use any tobacco products, including cigarettes, chewing tobacco, or electronic cigarettes. If you need help quitting, ask your health care provider.  It is important to eat a healthy diet and maintain a healthy weight. ? Be sure to include plenty of vegetables, fruits, low-fat dairy products, and lean protein. ? Avoid eating foods that are high in solid fats, added sugars, or salt (sodium).  Get regular exercise. This is one of the most important things that you can do for your health. ? Try to exercise for at least 150 minutes each week. The type of exercise that you do should increase your heart rate and make you sweat. This is known as moderate-intensity exercise. ? Try to do strengthening exercises at least twice each week. Do these in addition to the moderate-intensity exercise.  Know your numbers.Ask your health care provider to check your cholesterol and your blood glucose. Continue to have your blood tested as directed by your health care provider.  What should I know about cancer screening? There are several types of cancer. Take the following steps to reduce your risk and to catch any cancer development as early as possible. Breast Cancer  Practice breast self-awareness. ? This means understanding how your breasts normally appear and feel. ? It also means doing regular breast self-exams. Let your health care provider know about any changes, no matter how small.  If you are 40  or older, have a clinician do a breast exam (clinical breast exam or CBE) every year. Depending on your age, family history, and medical history, it may be recommended that you also have a yearly breast X-ray (mammogram).  If you  have a family history of breast cancer, talk with your health care provider about genetic screening.  If you are at high risk for breast cancer, talk with your health care provider about having an MRI and a mammogram every year.  Breast cancer (BRCA) gene test is recommended for women who have family members with BRCA-related cancers. Results of the assessment will determine the need for genetic counseling and BRCA1 and for BRCA2 testing. BRCA-related cancers include these types: ? Breast. This occurs in males or females. ? Ovarian. ? Tubal. This may also be called fallopian tube cancer. ? Cancer of the abdominal or pelvic lining (peritoneal cancer). ? Prostate. ? Pancreatic.  Cervical, Uterine, and Ovarian Cancer Your health care provider may recommend that you be screened regularly for cancer of the pelvic organs. These include your ovaries, uterus, and vagina. This screening involves a pelvic exam, which includes checking for microscopic changes to the surface of your cervix (Pap test).  For women ages 21-65, health care providers may recommend a pelvic exam and a Pap test every three years. For women ages 79-65, they may recommend the Pap test and pelvic exam, combined with testing for human papilloma virus (HPV), every five years. Some types of HPV increase your risk of cervical cancer. Testing for HPV may also be done on women of any age who have unclear Pap test results.  Other health care providers may not recommend any screening for nonpregnant women who are considered low risk for pelvic cancer and have no symptoms. Ask your health care provider if a screening pelvic exam is right for you.  If you have had past treatment for cervical cancer or a condition that could lead to cancer, you need Pap tests and screening for cancer for at least 20 years after your treatment. If Pap tests have been discontinued for you, your risk factors (such as having a new sexual partner) need to be  reassessed to determine if you should start having screenings again. Some women have medical problems that increase the chance of getting cervical cancer. In these cases, your health care provider may recommend that you have screening and Pap tests more often.  If you have a family history of uterine cancer or ovarian cancer, talk with your health care provider about genetic screening.  If you have vaginal bleeding after reaching menopause, tell your health care provider.  There are currently no reliable tests available to screen for ovarian cancer.  Lung Cancer Lung cancer screening is recommended for adults 69-62 years old who are at high risk for lung cancer because of a history of smoking. A yearly low-dose CT scan of the lungs is recommended if you:  Currently smoke.  Have a history of at least 30 pack-years of smoking and you currently smoke or have quit within the past 15 years. A pack-year is smoking an average of one pack of cigarettes per day for one year.  Yearly screening should:  Continue until it has been 15 years since you quit.  Stop if you develop a health problem that would prevent you from having lung cancer treatment.  Colorectal Cancer  This type of cancer can be detected and can often be prevented.  Routine colorectal cancer screening usually begins at  age 85 and continues through age 77.  If you have risk factors for colon cancer, your health care provider may recommend that you be screened at an earlier age.  If you have a family history of colorectal cancer, talk with your health care provider about genetic screening.  Your health care provider may also recommend using home test kits to check for hidden blood in your stool.  A small camera at the end of a tube can be used to examine your colon directly (sigmoidoscopy or colonoscopy). This is done to check for the earliest forms of colorectal cancer.  Direct examination of the colon should be repeated every  5-10 years until age 26. However, if early forms of precancerous polyps or small growths are found or if you have a family history or genetic risk for colorectal cancer, you may need to be screened more often.  Skin Cancer  Check your skin from head to toe regularly.  Monitor any moles. Be sure to tell your health care provider: ? About any new moles or changes in moles, especially if there is a change in a mole's shape or color. ? If you have a mole that is larger than the size of a pencil eraser.  If any of your family members has a history of skin cancer, especially at a young age, talk with your health care provider about genetic screening.  Always use sunscreen. Apply sunscreen liberally and repeatedly throughout the day.  Whenever you are outside, protect yourself by wearing long sleeves, pants, a wide-brimmed hat, and sunglasses.  What should I know about osteoporosis? Osteoporosis is a condition in which bone destruction happens more quickly than new bone creation. After menopause, you may be at an increased risk for osteoporosis. To help prevent osteoporosis or the bone fractures that can happen because of osteoporosis, the following is recommended:  If you are 24-47 years old, get at least 1,000 mg of calcium and at least 600 mg of vitamin D per day.  If you are older than age 65 but younger than age 80, get at least 1,200 mg of calcium and at least 600 mg of vitamin D per day.  If you are older than age 61, get at least 1,200 mg of calcium and at least 800 mg of vitamin D per day.  Smoking and excessive alcohol intake increase the risk of osteoporosis. Eat foods that are rich in calcium and vitamin D, and do weight-bearing exercises several times each week as directed by your health care provider. What should I know about how menopause affects my mental health? Depression may occur at any age, but it is more common as you become older. Common symptoms of depression  include:  Low or sad mood.  Changes in sleep patterns.  Changes in appetite or eating patterns.  Feeling an overall lack of motivation or enjoyment of activities that you previously enjoyed.  Frequent crying spells.  Talk with your health care provider if you think that you are experiencing depression. What should I know about immunizations? It is important that you get and maintain your immunizations. These include:  Tetanus, diphtheria, and pertussis (Tdap) booster vaccine.  Influenza every year before the flu season begins.  Pneumonia vaccine.  Shingles vaccine.  Your health care provider may also recommend other immunizations. This information is not intended to replace advice given to you by your health care provider. Make sure you discuss any questions you have with your health care provider. Document Released: 02/22/2005  Document Revised: 07/21/2015 Document Reviewed: 10/04/2014 Elsevier Interactive Patient Education  2018 [employer]  

## 2017-05-09 NOTE — Progress Notes (Signed)
Subjective:   Morgan Oneill is a 77 y.o. female who presents for an Initial Medicare Annual Wellness Visit.  Review of Systems    N/A  Cardiac Risk Factors include: advanced age (>36men, >28 women);dyslipidemia     Objective:    Today's Vitals   05/09/17 0914  BP: (!) 134/56  Pulse: 64  Temp: 97.7 F (36.5 C)  TempSrc: Oral  Weight: 147 lb 6.4 oz (66.9 kg)  PainSc: 0-No pain   Body mass index is 23.09 kg/m.  Advanced Directives 05/09/2017 06/21/2015  Does Patient Have a Medical Advance Directive? Yes No  Type of Advance Directive [place] in Chart? No - copy requested -    Current Medications (verified) No outpatient encounter medications on file as of 05/09/2017.   No facility-administered encounter medications on file as of 05/09/2017.     Allergies (verified) Patient has no known allergies.   History: Past Medical History:  Diagnosis Date  . Hematuria   . Hyperlipidemia   . Osteopenia   . Skin cancer    Past Surgical History:  Procedure Laterality Date  . ABDOMINOPLASTY    . BUNIONECTOMY Bilateral 1980  . COLONOSCOPY    . COLONOSCOPY WITH PROPOFOL N/A 06/21/2015   Procedure: COLONOSCOPY WITH PROPOFOL;  Surgeon: Manya Silvas, MD;  Location: [facility] ENDOSCOPY;  Service: Endoscopy;  Laterality: N/A;  . FRACTURE SURGERY Left 2009   Family History  Problem Relation Age of Onset  . Cancer Father        lung ; smoker and alcoholic  . Colon cancer Brother    Social History   Socioeconomic History  . Marital status: Married    Spouse name: Not on file  . Number of children: 1  . Years of education: Not on file  . Highest education level: GED or equivalent  Occupational History  . Occupation: alterations  Social Needs  . Financial resource strain: Not hard at all  . Food insecurity:    Worry: Never true    Inability: Never true  . Transportation needs:    Medical: No    Non-medical: No    Tobacco Use  . Smoking status: Never Smoker  . Smokeless tobacco: Never Used  Substance and Sexual Activity  . Alcohol use: No  . Drug use: No  . Sexual activity: Not on file  Lifestyle  . Physical activity:    Days per week: Not on file    Minutes per session: Not on file  . Stress: Not at all  Relationships  . Social connections:    Talks on phone: Not on file    Gets together: Not on file    Attends religious service: Not on file    Active member of club or organization: Not on file    Attends meetings of clubs or organizations: Not on file    Relationship status: Not on file  Other Topics Concern  . Not on file  Social History Narrative  . Not on file    Tobacco Counseling Counseling given: Not Answered   Clinical Intake:  Pre-visit preparation completed: Yes  Pain : No/denies pain Pain Score: 0-No pain     Nutritional Status: BMI of 19-24  Normal Nutritional Risks: None Diabetes: No  How often do you need to have someone help you when you read instructions, pamphlets, or other written materials from your doctor or pharmacy?: 1 - Never  Interpreter Needed?: No  Information entered by :: [facility], LPN   Activities of Daily Living In your present state of health, do you have any difficulty performing the following activities: 05/09/2017  Hearing? N  Vision? N  Difficulty concentrating or making decisions? N  Walking or climbing stairs? N  Dressing or bathing? N  Doing errands, shopping? N  Preparing Food and eating ? N  Using the Toilet? N  In the past six months, have you accidently leaked urine? Y  Comment Occasionally, wears protection.  Do you have problems with loss of bowel control? N  Managing your Medications? N  Managing your Finances? N  Housekeeping or managing your Housekeeping? N  Some recent data might be hidden     Immunizations and Health Maintenance Immunization History  Administered Date(s) Administered  . Pneumococcal  Polysaccharide-23 03/11/2012  . Tdap 01/15/2011  . Zoster 03/11/2012   Health Maintenance Due  Topic Date Due  . MAMMOGRAM  11/17/1992  . PNA vac Low Risk Adult (2 of 2 - PCV13) 03/11/2013    Patient Care Team: Mar Daring, PA-C as PCP - General (Family Medicine)  Indicate any recent Medical Services you may have received from other than [facility] providers in the past year (date may be approximate).     Assessment:   This is a routine wellness examination for Morgan Oneill.  Hearing/Vision screen Vision Screening Comments: Pt sees Dr Steffanie Rainwater for vision checks ever 1-2 years.   Dietary issues and exercise activities discussed: Current Exercise Habits: Home exercise routine, Type of exercise: walking, Time (Minutes): > 60(1.5 hours), Frequency (Times/Week): 2(to 3 days a week), Weekly Exercise (Minutes/Week): 0, Intensity: Mild, Exercise limited by: None identified  Goals    . DIET - INCREASE WATER INTAKE     Recommend increasing water intake to 4 glasses a day.       Depression Screen PHQ 2/9 Scores 05/09/2017 05/09/2017  PHQ - 2 Score 0 0  PHQ- 9 Score 0 -    Fall Risk Fall Risk  05/09/2017  Falls in the past year? No    Is the patient's home free of loose throw rugs in walkways, pet beds, electrical cords, etc?   yes      Grab bars in the bathroom? yes      Handrails on the stairs?   yes      Adequate lighting?   yes  Timed Get Up and Go Performed N/A  Cognitive Function: Pt declined screening today.        Screening Tests Health Maintenance  Topic Date Due  . MAMMOGRAM  11/17/1992  . PNA vac Low Risk Adult (2 of 2 - PCV13) 03/11/2013  . INFLUENZA VACCINE  08/14/2017  . TETANUS/TDAP  01/14/2021  . COLONOSCOPY  06/20/2025  . DEXA SCAN  Completed    Qualifies for Shingles Vaccine? Due for Shingles vaccine. Declined my offer to administer today. Education has been provided regarding the importance of this vaccine. Pt has been advised to call her insurance  company to determine her out of pocket expense. Advised she may also receive this vaccine at her local pharmacy or Health Dept. Verbalized acceptance and understanding.  Cancer Screenings: Lung: Low Dose CT Chest recommended if Age 46-80 years, 30 pack-year currently smoking OR have quit w/in 15years. Patient does not qualify. Breast: Up to date on Mammogram? No, pt declines at this time.    Up to date of Bone Density/Dexa? Yes Colorectal: Up to date  Additional Screenings:  Hepatitis C Screening: Up  to date     Plan:  I have personally reviewed and addressed the Medicare Annual Wellness questionnaire and have noted the following in the patient's chart:  A. Medical and social history B. Use of alcohol, tobacco or illicit drugs  C. Current medications and supplements D. Functional ability and status E.  Nutritional status F.  Physical activity G. Advance directives H. List of other physicians I.  Hospitalizations, surgeries, and ER visits in previous 12 months J.  Gray such as hearing and vision if needed, cognitive and depression L. Referrals and appointments - none  In addition, I have reviewed and discussed with patient certain preventive protocols, quality metrics, and best practice recommendations. A written personalized care plan for preventive services as well as general preventive health recommendations were provided to patient.  See attached scanned questionnaire for additional information.   Signed,  Fabio Neighbors, LPN Nurse Health Advisor   Nurse Recommendations: Pt is requesting a pap smear and to be tested for HPV today during physical. Pt declined setting up a mammogram this year and declines receiving the Prevnar 13 vaccine today.

## 2017-05-09 NOTE — Patient Instructions (Signed)
Morgan Oneill , Thank you for taking time to come for your Medicare Wellness Visit. I appreciate your ongoing commitment to your health goals. Please review the following plan we discussed and let me know if I can assist you in the future.   Screening recommendations/referrals: Colonoscopy: Up to date Mammogram: Pt declines today.  Bone Density: Up to date Recommended yearly ophthalmology/optometry visit for glaucoma screening and checkup Recommended yearly dental visit for hygiene and checkup  Vaccinations: Influenza vaccine: N/A Pneumococcal vaccine: Pt declines Prevnar 13 today.  Tdap vaccine: Up to date Shingles vaccine: Pt declines today.     Advanced directives: Please bring a copy of your POA (Power of Attorney) and/or Living Will to your next appointment.   Conditions/risks identified: Recommend increasing water intake to 4 glasses a day.   Next appointment: 9:40 AM with Fenton Malling.   Preventive Care 75 Years and Older, Female Preventive care refers to lifestyle choices and visits with your health care provider that can promote health and wellness. What does preventive care include?  A yearly physical exam. This is also called an annual well check.  Dental exams once or twice a year.  Routine eye exams. Ask your health care provider how often you should have your eyes checked.  Personal lifestyle choices, including:  Daily care of your teeth and gums.  Regular physical activity.  Eating a healthy diet.  Avoiding tobacco and drug use.  Limiting alcohol use.  Practicing safe sex.  Taking low-dose aspirin every day.  Taking vitamin and mineral supplements as recommended by your health care provider. What happens during an annual well check? The services and screenings done by your health care provider during your annual well check will depend on your age, overall health, lifestyle risk factors, and family history of disease. Counseling  Your health care  provider may ask you questions about your:  Alcohol use.  Tobacco use.  Drug use.  Emotional well-being.  Home and relationship well-being.  Sexual activity.  Eating habits.  History of falls.  Memory and ability to understand (cognition).  Work and work Statistician.  Reproductive health. Screening  You may have the following tests or measurements:  Height, weight, and BMI.  Blood pressure.  Lipid and cholesterol levels. These may be checked every 5 years, or more frequently if you are over 54 years old.  Skin check.  Lung cancer screening. You may have this screening every year starting at age 24 if you have a 30-pack-year history of smoking and currently smoke or have quit within the past 15 years.  Fecal occult blood test (FOBT) of the stool. You may have this test every year starting at age 56.  Flexible sigmoidoscopy or colonoscopy. You may have a sigmoidoscopy every 5 years or a colonoscopy every 10 years starting at age 30.  Hepatitis C blood test.  Hepatitis B blood test.  Sexually transmitted disease (STD) testing.  Diabetes screening. This is done by checking your blood sugar (glucose) after you have not eaten for a while (fasting). You may have this done every 1-3 years.  Bone density scan. This is done to screen for osteoporosis. You may have this done starting at age 29.  Mammogram. This may be done every 1-2 years. Talk to your health care provider about how often you should have regular mammograms. Talk with your health care provider about your test results, treatment options, and if necessary, the need for more tests. Vaccines  Your health care provider may recommend  certain vaccines, such as:  Influenza vaccine. This is recommended every year.  Tetanus, diphtheria, and acellular pertussis (Tdap, Td) vaccine. You may need a Td booster every 10 years.  Zoster vaccine. You may need this after age 23.  Pneumococcal 13-valent conjugate (PCV13)  vaccine. One dose is recommended after age 70.  Pneumococcal polysaccharide (PPSV23) vaccine. One dose is recommended after age 30. Talk to your health care provider about which screenings and vaccines you need and how often you need them. This information is not intended to replace advice given to you by your health care provider. Make sure you discuss any questions you have with your health care provider. Document Released: 01/27/2015 Document Revised: 09/20/2015 Document Reviewed: 11/01/2014 Elsevier Interactive Patient Education  2017 [place] Prevention in the Home Falls can cause injuries. They can happen to people of all ages. There are many things you can do to make your home safe and to help prevent falls. What can I do on the outside of my home?  Regularly fix the edges of walkways and driveways and fix any cracks.  Remove anything that might make you trip as you walk through a door, such as a raised step or threshold.  Trim any bushes or trees on the path to your home.  Use bright outdoor lighting.  Clear any walking paths of anything that might make someone trip, such as rocks or tools.  Regularly check to see if handrails are loose or broken. Make sure that both sides of any steps have handrails.  Any raised decks and porches should have guardrails on the edges.  Have any leaves, snow, or ice cleared regularly.  Use sand or salt on walking paths during winter.  Clean up any spills in your garage right away. This includes oil or grease spills. What can I do in the bathroom?  Use night lights.  Install grab bars by the toilet and in the tub and shower. Do not use towel bars as grab bars.  Use non-skid mats or decals in the tub or shower.  If you need to sit down in the shower, use a plastic, non-slip stool.  Keep the floor dry. Clean up any water that spills on the floor as soon as it happens.  Remove soap buildup in the tub or shower  regularly.  Attach bath mats securely with double-sided non-slip rug tape.  Do not have throw rugs and other things on the floor that can make you trip. What can I do in the bedroom?  Use night lights.  Make sure that you have a light by your bed that is easy to reach.  Do not use any sheets or blankets that are too big for your bed. They should not hang down onto the floor.  Have a firm chair that has side arms. You can use this for support while you get dressed.  Do not have throw rugs and other things on the floor that can make you trip. What can I do in the kitchen?  Clean up any spills right away.  Avoid walking on wet floors.  Keep items that you use a lot in easy-to-reach places.  If you need to reach something above you, use a strong step stool that has a grab bar.  Keep electrical cords out of the way.  Do not use floor polish or wax that makes floors slippery. If you must use wax, use non-skid floor wax.  Do not have throw rugs and other  things on the floor that can make you trip. What can I do with my stairs?  Do not leave any items on the stairs.  Make sure that there are handrails on both sides of the stairs and use them. Fix handrails that are broken or loose. Make sure that handrails are as long as the stairways.  Check any carpeting to make sure that it is firmly attached to the stairs. Fix any carpet that is loose or worn.  Avoid having throw rugs at the top or bottom of the stairs. If you do have throw rugs, attach them to the floor with carpet tape.  Make sure that you have a light switch at the top of the stairs and the bottom of the stairs. If you do not have them, ask someone to add them for you. What else can I do to help prevent falls?  Wear shoes that:  Do not have high heels.  Have rubber bottoms.  Are comfortable and fit you well.  Are closed at the toe. Do not wear sandals.  If you use a stepladder:  Make sure that it is fully  opened. Do not climb a closed stepladder.  Make sure that both sides of the stepladder are locked into place.  Ask someone to hold it for you, if possible.  Clearly mark and make sure that you can see:  Any grab bars or handrails.  First and last steps.  Where the edge of each step is.  Use tools that help you move around (mobility aids) if they are needed. These include:  Canes.  Walkers.  Scooters.  Crutches.  Turn on the lights when you go into a dark area. Replace any light bulbs as soon as they burn out.  Set up your furniture so you have a clear path. Avoid moving your furniture around.  If any of your floors are uneven, fix them.  If there are any pets around you, be aware of where they are.  Review your medicines with your doctor. Some medicines can make you feel dizzy. This can increase your chance of falling. Ask your doctor what other things that you can do to help prevent falls. This information is not intended to replace advice given to you by your health care provider. Make sure you discuss any questions you have with your health care provider. Document Released: 10/27/2008 Document Revised: 06/08/2015 Document Reviewed: 02/04/2014 Elsevier Interactive Patient Education  2017 [employer].

## 2017-05-10 LAB — TSH: TSH: 1.85 u[IU]/mL (ref 0.450–4.500)

## 2017-05-10 LAB — LIPID PANEL
CHOL/HDL RATIO: 3.5 ratio (ref 0.0–4.4)
CHOLESTEROL TOTAL: 204 mg/dL — AB (ref 100–199)
HDL: 59 mg/dL (ref >39)
LDL CALC: 134 mg/dL — AB (ref 0–99)
Triglycerides: 55 mg/dL (ref 0–149)
VLDL Cholesterol Cal: 11 mg/dL (ref 5–40)

## 2017-05-10 LAB — CBC WITH DIFFERENTIAL/PLATELET
BASOS: 1 %
Basophils Absolute: 0 10*3/uL (ref 0.0–0.2)
EOS (ABSOLUTE): 0.2 10*3/uL (ref 0.0–0.4)
EOS: 3 %
HEMATOCRIT: 39.4 % (ref 34.0–46.6)
HEMOGLOBIN: 13.4 g/dL (ref 11.1–15.9)
Immature Grans (Abs): 0 10*3/uL (ref 0.0–0.1)
Immature Granulocytes: 0 %
LYMPHS ABS: 1.8 10*3/uL (ref 0.7–3.1)
Lymphs: 28 %
MCH: 29.6 pg (ref 26.6–33.0)
MCHC: 34 g/dL (ref 31.5–35.7)
MCV: 87 fL (ref 79–97)
MONOCYTES: 9 %
Monocytes Absolute: 0.6 10*3/uL (ref 0.1–0.9)
NEUTROS ABS: 3.9 10*3/uL (ref 1.4–7.0)
Neutrophils: 59 %
Platelets: 182 10*3/uL (ref 150–379)
RBC: 4.53 x10E6/uL (ref 3.77–5.28)
RDW: 14.5 % (ref 12.3–15.4)
WBC: 6.4 10*3/uL (ref 3.4–10.8)

## 2017-05-10 LAB — COMPREHENSIVE METABOLIC PANEL
A/G RATIO: 1.8 (ref 1.2–2.2)
ALBUMIN: 4.4 g/dL (ref 3.5–4.8)
ALK PHOS: 73 IU/L (ref 39–117)
ALT: 15 IU/L (ref 0–32)
AST: 26 IU/L (ref 0–40)
BUN / CREAT RATIO: 18 (ref 12–28)
BUN: 17 mg/dL (ref 8–27)
Bilirubin Total: 0.5 mg/dL (ref 0.0–1.2)
CO2: 25 mmol/L (ref 20–29)
Calcium: 9.3 mg/dL (ref 8.7–10.3)
Chloride: 104 mmol/L (ref 96–106)
Creatinine, Ser: 0.92 mg/dL (ref 0.57–1.00)
GFR calc Af Amer: 71 mL/min/{1.73_m2} (ref >59)
GFR, EST NON AFRICAN AMERICAN: 62 mL/min/{1.73_m2} (ref >59)
GLOBULIN, TOTAL: 2.4 g/dL (ref 1.5–4.5)
Glucose: 79 mg/dL (ref 65–99)
Potassium: 4 mmol/L (ref 3.5–5.2)
SODIUM: 143 mmol/L (ref 134–144)
Total Protein: 6.8 g/dL (ref 6.0–8.5)

## 2017-05-12 ENCOUNTER — Telehealth: Payer: Self-pay

## 2017-05-12 NOTE — Telephone Encounter (Signed)
Faxed record release form to [place] in order to retrieve pts previous records and update HM in her chart. Will keep open until records have been received.  -MM

## 2017-05-14 ENCOUNTER — Telehealth: Payer: Self-pay

## 2017-05-14 LAB — PAP IG AND HPV HIGH-RISK
HPV, HIGH-RISK: NEGATIVE
PAP Smear Comment: 0

## 2017-05-14 NOTE — Telephone Encounter (Signed)
Patient advised as below.  

## 2017-05-14 NOTE — Telephone Encounter (Signed)
-----   Message from Mar Daring, PA-C sent at 05/14/2017  8:20 AM EDT ----- Pap is normal., HPV negative.   Lets repeat one more year to make sure still negative. If still negative next year can stop paps if desired.

## 2017-05-22 NOTE — Telephone Encounter (Signed)
Declined fax twice. Called [facility] and confirmed fax number. Re-faxed today.  -MM

## 2017-07-03 DIAGNOSIS — J069 Acute upper respiratory infection, unspecified: Secondary | ICD-10-CM | POA: Diagnosis not present

## 2017-07-03 DIAGNOSIS — R05 Cough: Secondary | ICD-10-CM | POA: Diagnosis not present

## 2017-09-05 DIAGNOSIS — H40003 Preglaucoma, unspecified, bilateral: Secondary | ICD-10-CM | POA: Diagnosis not present

## 2017-09-05 DIAGNOSIS — H268 Other specified cataract: Secondary | ICD-10-CM | POA: Diagnosis not present

## 2017-10-01 DIAGNOSIS — M216X1 Other acquired deformities of right foot: Secondary | ICD-10-CM | POA: Diagnosis not present

## 2017-10-01 DIAGNOSIS — D2372 Other benign neoplasm of skin of left lower limb, including hip: Secondary | ICD-10-CM | POA: Diagnosis not present

## 2017-10-01 DIAGNOSIS — M79672 Pain in left foot: Secondary | ICD-10-CM | POA: Diagnosis not present

## 2017-10-22 DIAGNOSIS — M216X1 Other acquired deformities of right foot: Secondary | ICD-10-CM | POA: Diagnosis not present

## 2017-10-22 DIAGNOSIS — D2372 Other benign neoplasm of skin of left lower limb, including hip: Secondary | ICD-10-CM | POA: Diagnosis not present

## 2017-10-22 DIAGNOSIS — M79672 Pain in left foot: Secondary | ICD-10-CM | POA: Diagnosis not present

## 2018-05-08 ENCOUNTER — Telehealth: Payer: Self-pay | Admitting: Physician Assistant

## 2018-05-11 ENCOUNTER — Encounter: Payer: Self-pay | Admitting: Physician Assistant

## 2018-05-11 ENCOUNTER — Other Ambulatory Visit: Payer: Self-pay

## 2018-05-11 ENCOUNTER — Other Ambulatory Visit (HOSPITAL_COMMUNITY)
Admission: RE | Admit: 2018-05-11 | Discharge: 2018-05-11 | Disposition: A | Discharge status: Home / Self Care | Payer: PPO | Source: Ambulatory Visit | Attending: Physician Assistant | Admitting: Physician Assistant

## 2018-05-11 ENCOUNTER — Ambulatory Visit (INDEPENDENT_AMBULATORY_CARE_PROVIDER_SITE_OTHER): Payer: PPO | Admitting: Physician Assistant

## 2018-05-11 VITALS — BP 145/89 | HR 83 | Temp 98.2°F | Wt 150.0 lb

## 2018-05-11 DIAGNOSIS — R300 Dysuria: Secondary | ICD-10-CM

## 2018-05-11 DIAGNOSIS — R87618 Other abnormal cytological findings on specimens from cervix uteri: Secondary | ICD-10-CM

## 2018-05-11 DIAGNOSIS — R1030 Lower abdominal pain, unspecified: Secondary | ICD-10-CM

## 2018-05-11 DIAGNOSIS — R8789 Other abnormal findings in specimens from female genital organs: Secondary | ICD-10-CM | POA: Insufficient documentation

## 2018-05-11 DIAGNOSIS — R768 Other specified abnormal immunological findings in serum: Secondary | ICD-10-CM | POA: Diagnosis not present

## 2018-05-11 DIAGNOSIS — Z124 Encounter for screening for malignant neoplasm of cervix: Secondary | ICD-10-CM

## 2018-05-11 DIAGNOSIS — R311 Benign essential microscopic hematuria: Secondary | ICD-10-CM | POA: Diagnosis not present

## 2018-05-11 DIAGNOSIS — D3001 Benign neoplasm of right kidney: Secondary | ICD-10-CM

## 2018-05-11 DIAGNOSIS — R87619 Unspecified abnormal cytological findings in specimens from cervix uteri: Secondary | ICD-10-CM | POA: Diagnosis not present

## 2018-05-11 DIAGNOSIS — D1771 Benign lipomatous neoplasm of kidney: Secondary | ICD-10-CM

## 2018-05-11 DIAGNOSIS — W57XXXA Bitten or stung by nonvenomous insect and other nonvenomous arthropods, initial encounter: Secondary | ICD-10-CM

## 2018-05-11 LAB — POCT URINALYSIS DIPSTICK
Bilirubin, UA: NEGATIVE
Glucose, UA: NEGATIVE
Ketones, UA: NEGATIVE
Leukocytes, UA: NEGATIVE
Nitrite, UA: NEGATIVE
Protein, UA: NEGATIVE
Spec Grav, UA: 1.025 (ref 1.010–1.025)
Urobilinogen, UA: 0.2 E.U./dL
pH, UA: 5 (ref 5.0–8.0)

## 2018-05-11 NOTE — Telephone Encounter (Signed)
Error

## 2018-05-11 NOTE — Progress Notes (Signed)
Patient: Morgan Oneill Female    DOB: [birth year]   78 y.o.   MRN: [id number] Visit Date: 05/11/2018  Today's Provider: Mar Daring, PA-C   Chief Complaint  Patient presents with  . Abdominal Pain  . Back Pain   Subjective:    I, Porsha McClurkin CMA, am acting as a Education administrator for [employer].   Abdominal Pain  The current episode started 1 to 4 weeks ago. The problem occurs constantly. The problem has been unchanged. The pain is located in the LLQ, RLQ and periumbilical region. The pain is at a severity of 7/10. The pain is moderate. The quality of the pain is aching and dull. The abdominal pain radiates to the back. Pertinent negatives include no constipation, diarrhea, dysuria, fever, frequency, hematuria, myalgias, nausea or vomiting. The pain is relieved by activity, movement, standing and sitting up.  Back Pain  The current episode started 1 to 4 weeks ago. The problem occurs constantly. The problem is unchanged. The quality of the pain is described as aching and cramping. The pain does not radiate. The pain is at a severity of 7/10. The pain is moderate. The pain is the same all the time. The symptoms are aggravated by sitting and standing. Associated symptoms include abdominal pain. Pertinent negatives include no dysuria, fever, numbness, pelvic pain or weakness.   Patient states she had a tick bite 2 weeks ago and had a sore on her thigh/groin area. She is unsure if she had a tick in that area as well. She had one tick in her left axilla and one on her chest. Then approx 2 days after the ticks were removed she felt a spot in her left groin area. She reports it was a sore area that had some bloody drainage. She washed it and then started putting neosporin on it. After starting the neosporin it started to heal. She reports it took 10 days to heal. Patient states after the spot cleared up she started having the abdominal and back pain. She denies any bowel changes. No  dysuria. No fevers.    No Known Allergies  No current outpatient medications on file.  Review of Systems  Constitutional: Negative.  Negative for fatigue and fever.  Respiratory: Negative.   Cardiovascular: Negative.   Gastrointestinal: Positive for abdominal pain. Negative for abdominal distention, blood in stool, constipation, diarrhea, nausea and vomiting.  Genitourinary: Negative.  Negative for dysuria, flank pain, frequency, hematuria, pelvic pain, urgency, vaginal bleeding, vaginal discharge and vaginal pain.  Musculoskeletal: Positive for back pain. Negative for myalgias.  Skin: Positive for wound (healed; left groin).  Neurological: Negative.  Negative for weakness and numbness.  Psychiatric/Behavioral: Negative.     Social History   Tobacco Use  . Smoking status: Never Smoker  . Smokeless tobacco: Never Used  Substance Use Topics  . Alcohol use: No      Objective:   Wt 150 lb (68 kg)   BMI 23.49 kg/m  Vitals:   05/11/18 1000  Weight: 150 lb (68 kg)     Physical Exam Vitals signs reviewed. Exam conducted with a chaperone present.  Constitutional:      General: She is not in acute distress.    Appearance: Normal appearance. She is well-developed and normal weight. She is not ill-appearing or diaphoretic.  HENT:     Head: Normocephalic and atraumatic.  Eyes:     General: No scleral icterus.    Extraocular Movements: Extraocular movements intact.  Cardiovascular:     Rate and Rhythm: Normal rate and regular rhythm.     Heart sounds: Normal heart sounds. No murmur. No friction rub. No gallop.   Pulmonary:     Effort: Pulmonary effort is normal. No respiratory distress.     Breath sounds: Normal breath sounds. No wheezing or rales.  Abdominal:     General: Abdomen is flat. Bowel sounds are normal. There is no distension.     Palpations: Abdomen is soft. There is no mass.     Tenderness: There is no abdominal tenderness. There is no guarding or rebound.      Hernia: No hernia is present. There is no hernia in the right inguinal area or left inguinal area.     Comments: Suprapubic tenderness  Genitourinary:    Exam position: Supine.     Pubic Area: No rash.      Labia:        Right: No rash, tenderness, lesion or injury.        Left: No rash, tenderness, lesion or injury.      Urethra: No prolapse, urethral pain, urethral swelling or urethral lesion.     Vagina: Normal. No vaginal discharge, erythema, tenderness, bleeding, lesions or prolapsed vaginal walls.     Cervix: No cervical motion tenderness, discharge, friability, lesion, erythema or cervical bleeding.     Uterus: Normal.      Adnexa: Right adnexa normal and left adnexa normal.     Rectum: Normal.    Lymphadenopathy:     Lower Body: No right inguinal adenopathy. No left inguinal adenopathy.  Skin:    General: Skin is warm and dry.  Neurological:     Mental Status: She is alert and oriented to person, place, and time.         Assessment & Plan    1. Dysuria UA normal with exception of microscopic hematuria. She has had this for many years. Reports previously followed by Dr. Bernardo Heater, Urology. Has known angiomyolipoma of the right kidney. Last imaged in 2013 and stable.  - POCT urinalysis dipstick - Comprehensive Metabolic Panel (CMET)  2. Pap smear abnormality of cervix/human papillomavirus (HPV) positive Last 2 paps have been normal. Three years ago she had a normal pap with positive high risk HPV. She has been screened annually since. Last 2 pap smears have been normal. If today is normal, she can return to regular screenings every 3-5 years. She agrees. I will f/u pending results.  - Cytology - PAP  3. Encounter for Pap cervical smear following prior abnormal smear See above medical treatment plan. - Cytology - PAP  4. Angiomyolipoma of right kidney See above medical treatment plan for #1. Will get [place] as below to f/u and make sure continued stable. If stable, most  likely will not need re-imaging in the future as it would have been stable x 10 years.  - US Renal; Future - Comprehensive Metabolic Panel (CMET)  5. Benign essential microscopic hematuria See above medical treatment plan and for #1.  - US Renal; Future  6. Tick bite, initial encounter Will check labs as below to r/o Lyme.  - CBC w/Diff/Platelet - Comprehensive Metabolic Panel (CMET) - B. burgdorfi antibodies  7. Lower abdominal pain If all labs and testing are normal will f/u with ordering an abdominal xray and lumbar xray. May consider pelvic US if all work up is negative.     Mar Daring, PA-C  [facility]

## 2018-05-11 NOTE — Patient Instructions (Signed)
Abdominal Pain, Adult  Abdominal pain can be caused by many things. Often, abdominal pain is not serious and it gets better with no treatment or by being treated at home. However, sometimes abdominal pain is serious. Your health care provider will do a medical history and a physical exam to try to determine the cause of your abdominal pain.  Follow these instructions at home:   Take over-the-counter and prescription medicines only as told by your health care provider. Do not take a laxative unless told by your health care provider.   Drink enough fluid to keep your urine clear or pale yellow.   Watch your condition for any changes.   Keep all follow-up visits as told by your health care provider. This is important.  Contact a health care provider if:   Your abdominal pain changes or gets worse.   You are not hungry or you lose weight without trying.   You are constipated or have diarrhea for more than 2-3 days.   You have pain when you urinate or have a bowel movement.   Your abdominal pain wakes you up at night.   Your pain gets worse with meals, after eating, or with certain foods.   You are throwing up and cannot keep anything down.   You have a fever.  Get help right away if:   Your pain does not go away as soon as your health care provider told you to expect.   You cannot stop throwing up.   Your pain is only in areas of the abdomen, such as the right side or the left lower portion of the abdomen.   You have bloody or black stools, or stools that look like tar.   You have severe pain, cramping, or bloating in your abdomen.   You have signs of dehydration, such as:  ? Dark urine, very little urine, or no urine.  ? Cracked lips.  ? Dry mouth.  ? Sunken eyes.  ? Sleepiness.  ? Weakness.  This information is not intended to replace advice given to you by your health care provider. Make sure you discuss any questions you have with your health care provider.  Document Released: 10/10/2004 Document  Revised: 07/21/2015 Document Reviewed: 06/14/2015  Elsevier Interactive Patient Education  2019 [employer]

## 2018-05-13 ENCOUNTER — Telehealth: Payer: Self-pay

## 2018-05-13 LAB — COMPREHENSIVE METABOLIC PANEL
ALT: 16 IU/L (ref 0–32)
AST: 26 IU/L (ref 0–40)
Albumin/Globulin Ratio: 2 (ref 1.2–2.2)
Albumin: 4.5 g/dL (ref 3.7–4.7)
Alkaline Phosphatase: 67 IU/L (ref 39–117)
BUN/Creatinine Ratio: 20 (ref 12–28)
BUN: 20 mg/dL (ref 8–27)
Bilirubin Total: 0.5 mg/dL (ref 0.0–1.2)
CO2: 24 mmol/L (ref 20–29)
Calcium: 9.4 mg/dL (ref 8.7–10.3)
Chloride: 102 mmol/L (ref 96–106)
Creatinine, Ser: 1 mg/dL (ref 0.57–1.00)
GFR calc Af Amer: 64 mL/min/{1.73_m2} (ref >59)
GFR calc non Af Amer: 55 mL/min/{1.73_m2} — ABNORMAL LOW (ref >59)
Globulin, Total: 2.2 g/dL (ref 1.5–4.5)
Glucose: 86 mg/dL (ref 65–99)
Potassium: 4.4 mmol/L (ref 3.5–5.2)
Sodium: 138 mmol/L (ref 134–144)
Total Protein: 6.7 g/dL (ref 6.0–8.5)

## 2018-05-13 LAB — CBC WITH DIFFERENTIAL/PLATELET
Basophils Absolute: 0.1 10*3/uL (ref 0.0–0.2)
Basos: 1 %
EOS (ABSOLUTE): 0.3 10*3/uL (ref 0.0–0.4)
Eos: 4 %
Hematocrit: 38.1 % (ref 34.0–46.6)
Hemoglobin: 13.3 g/dL (ref 11.1–15.9)
Immature Grans (Abs): 0 10*3/uL (ref 0.0–0.1)
Immature Granulocytes: 0 %
Lymphocytes Absolute: 1.5 10*3/uL (ref 0.7–3.1)
Lymphs: 24 %
MCH: 29.8 pg (ref 26.6–33.0)
MCHC: 34.9 g/dL (ref 31.5–35.7)
MCV: 85 fL (ref 79–97)
Monocytes Absolute: 0.6 10*3/uL (ref 0.1–0.9)
Monocytes: 9 %
Neutrophils Absolute: 4 10*3/uL (ref 1.4–7.0)
Neutrophils: 62 %
Platelets: 185 10*3/uL (ref 150–450)
RBC: 4.46 x10E6/uL (ref 3.77–5.28)
RDW: 13 % (ref 11.7–15.4)
WBC: 6.4 10*3/uL (ref 3.4–10.8)

## 2018-05-13 LAB — B. BURGDORFI ANTIBODIES: Lyme IgG/IgM Ab: 1.02 {ISR} — ABNORMAL HIGH (ref 0.00–0.90)

## 2018-05-13 LAB — CYTOLOGY - PAP
Diagnosis: NEGATIVE
HPV: NOT DETECTED

## 2018-05-13 LAB — LYME, WESTERN BLOT, SERUM (REFLEXED)
IgG P18 Ab.: ABSENT
IgG P23 Ab.: ABSENT
IgG P28 Ab.: ABSENT
IgG P30 Ab.: ABSENT
IgG P39 Ab.: ABSENT
IgG P41 Ab.: ABSENT
IgG P45 Ab.: ABSENT
IgG P58 Ab.: ABSENT
IgG P66 Ab.: ABSENT
IgG P93 Ab.: ABSENT
IgM P23 Ab.: ABSENT
IgM P39 Ab.: ABSENT
IgM P41 Ab.: ABSENT
Lyme IgG Wb: NEGATIVE
Lyme IgM Wb: NEGATIVE

## 2018-05-13 MED ORDER — DOXYCYCLINE HYCLATE 100 MG PO TABS: 100.0000 mg | ORAL_TABLET | Freq: Two times a day (BID) | ORAL | 0 refills | Status: DC

## 2018-05-13 NOTE — Telephone Encounter (Signed)
Patient was advised.  

## 2018-05-13 NOTE — Telephone Encounter (Signed)
-----   Message from Mar Daring, [place] sent at 05/13/2018  8:53 AM EDT ----- Labs are unremarkable with exception that Lyme antibody testing was borderline high. It is being reflexed over for more specific testing. I will reach out once those results are obtained. I will send in Doxycycline to treat Lyme disease. This antibiotic is taken twice daily. Make sure to take with food as it will upset your stomach. Also avoid direct sunlight (be covered, wear sunscreen) while on this as it will make your skin super sensitive to sunlight (burn very easily).

## 2018-05-13 NOTE — Telephone Encounter (Signed)
-----   Message from Mar Daring, [place] sent at 05/13/2018 11:22 AM EDT ----- Pap is normal, HPV negative.  Will repeat in 3-5 years since this is your 3rd normal.

## 2018-05-13 NOTE — Telephone Encounter (Signed)
Pt advised.  Please send RX to [facility].   Thanks,   -Mickel Baas

## 2018-05-13 NOTE — Telephone Encounter (Signed)
Done

## 2018-05-13 NOTE — Addendum Note (Signed)
Addended by: Mar Daring on: 05/13/2018 10:12 AM   Modules accepted: Orders

## 2018-05-13 NOTE — Addendum Note (Signed)
Addended by: Mar Daring on: 05/13/2018 08:56 AM   Modules accepted: Orders

## 2018-05-14 ENCOUNTER — Ambulatory Visit: Payer: Self-pay

## 2018-05-19 ENCOUNTER — Telehealth: Payer: Self-pay | Admitting: Physician Assistant

## 2018-05-19 NOTE — Telephone Encounter (Signed)
Luckily all antibody testing that was done to confirm Lyme disease were all negative. She does not have Lyme disease. She can stop antibiotic.

## 2018-05-19 NOTE — Telephone Encounter (Signed)
Please advise 

## 2018-05-19 NOTE — Telephone Encounter (Signed)
Patient came in to check on results for further testing that she said was being ran for lyme antibodies.  Please advise

## 2018-05-19 NOTE — Telephone Encounter (Signed)
Patient was advised.  

## 2018-05-27 ENCOUNTER — Ambulatory Visit (INDEPENDENT_AMBULATORY_CARE_PROVIDER_SITE_OTHER): Payer: PPO

## 2018-05-27 DIAGNOSIS — Z0000 Encounter for general adult medical examination without abnormal findings: Secondary | ICD-10-CM | POA: Diagnosis not present

## 2018-05-27 NOTE — Progress Notes (Signed)
Subjective:   Morgan Oneill is a 78 y.o. female who presents for Medicare Annual (Subsequent) preventive examination.    This visit is being conducted through telemedicine due to the COVID-19 pandemic. This patient has given me verbal consent via doximity to conduct this visit, patient states they are participating from their home address. Some vital signs may be absent or patient reported.    Patient identification: identified by name, DOB, and current address  Review of Systems:  N/A  Cardiac Risk Factors include: advanced age (>33men, >29 women);dyslipidemia     Objective:     Vitals: There were no vitals taken for this visit.  There is no height or weight on file to calculate BMI. Unable to obtain vitals due to visit being conducted via telephonically.    Advanced Directives 05/27/2018 05/09/2017 06/21/2015  Does Patient Have a Medical Advance Directive? Yes Yes No  Type of Paramedic of [place];Living will [place] in Chart? Yes - validated most recent copy scanned in chart (See row information) No - copy requested -    Tobacco Social History   Tobacco Use  Smoking Status Never Smoker  Smokeless Tobacco Never Used     Counseling given: Not Answered   Clinical Intake:  Pre-visit preparation completed: Yes  Pain : No/denies pain Pain Score: 0-No pain     Nutritional Status: BMI of 19-24  Normal Nutritional Risks: None Diabetes: No  How often do you need to have someone help you when you read instructions, pamphlets, or other written materials from your doctor or pharmacy?: 1 - Never  Interpreter Needed?: No  Information entered by :: [employer], LPN  Past Medical History:  Diagnosis Date  . Hematuria   . Hyperlipidemia   . Osteopenia   . Skin cancer    Past Surgical History:  Procedure Laterality Date  . ABDOMINOPLASTY    . BUNIONECTOMY Bilateral 1980  .  COLONOSCOPY    . COLONOSCOPY WITH PROPOFOL N/A 06/21/2015   Procedure: COLONOSCOPY WITH PROPOFOL;  Surgeon: Manya Silvas, MD;  Location: [facility] ENDOSCOPY;  Service: Endoscopy;  Laterality: N/A;  . FRACTURE SURGERY Left 2009   Family History  Problem Relation Age of Onset  . Cancer Father        lung ; smoker and alcoholic  . Colon cancer Brother    Social History   Socioeconomic History  . Marital status: Married    Spouse name: Not on file  . Number of children: 1  . Years of education: Not on file  . Highest education level: GED or equivalent  Occupational History  . Occupation: alterations  Social Needs  . Financial resource strain: Not hard at all  . Food insecurity:    Worry: Never true    Inability: Never true  . Transportation needs:    Medical: No    Non-medical: No  Tobacco Use  . Smoking status: Never Smoker  . Smokeless tobacco: Never Used  Substance and Sexual Activity  . Alcohol use: No  . Drug use: No  . Sexual activity: Not on file  Lifestyle  . Physical activity:    Days per week: 0 days    Minutes per session: 0 min  . Stress: Not at all  Relationships  . Social connections:    Talks on phone: Patient refused    Gets together: Patient refused    Attends religious service: Patient refused  Active member of club or organization: Patient refused    Attends meetings of clubs or organizations: Patient refused    Relationship status: Patient refused  Other Topics Concern  . Not on file  Social History Narrative  . Not on file    Outpatient Encounter Medications as of 05/27/2018  Medication Sig  . ibuprofen (ADVIL) 200 MG tablet Take 200 mg by mouth every 6 (six) hours as needed.  . doxycycline (VIBRA-TABS) 100 MG tablet Take 1 tablet (100 mg total) by mouth 2 (two) times daily. (Patient not taking: Reported on 05/27/2018)   No facility-administered encounter medications on file as of 05/27/2018.     Activities of Daily Living In your present  state of health, do you have any difficulty performing the following activities: 05/27/2018  Hearing? N  Vision? N  Difficulty concentrating or making decisions? N  Walking or climbing stairs? N  Dressing or bathing? N  Doing errands, shopping? N  Preparing Food and eating ? N  Using the Toilet? N  In the past six months, have you accidently leaked urine? Y  Comment Occasionally, wears protection daily.   Do you have problems with loss of bowel control? N  Managing your Medications? N  Managing your Finances? N  Housekeeping or managing your Housekeeping? N  Some recent data might be hidden    Patient Care Team: Rubye Beach as PCP - General (Family Medicine)    Assessment:   This is a routine wellness examination for Morgan Oneill.  Exercise Activities and Dietary recommendations Current Exercise Habits: The patient does not participate in regular exercise at present, Exercise limited by: None identified  Goals    . DIET - INCREASE WATER INTAKE     Recommend increasing water intake to 4 glasses a day.        Fall Risk: Fall Risk  05/27/2018 05/09/2017  Falls in the past year? 0 No    FALL RISK PREVENTION PERTAINING TO THE HOME:  Any stairs in or around the home? No  If so, are there any without handrails? N/A  Home free of loose throw rugs in walkways, pet beds, electrical cords, etc? Yes  Adequate lighting in your home to reduce risk of falls? Yes   ASSISTIVE DEVICES UTILIZED TO PREVENT FALLS:  Life alert? No  Use of a cane, walker or w/c? No  Grab bars in the bathroom? No  Shower chair or bench in shower? No  Elevated toilet seat or a handicapped toilet? No    TIMED UP AND GO:  Was the test performed? No .    Depression Screen PHQ 2/9 Scores 05/27/2018 05/27/2018 05/09/2017 05/09/2017  PHQ - 2 Score 0 0 0 0  PHQ- 9 Score 0 - 0 -     Cognitive Function: Declined today.         Immunization History  Administered Date(s) Administered  .  Pneumococcal Polysaccharide-23 03/11/2012  . Tdap 01/15/2011  . Zoster 03/11/2012    Qualifies for Shingles Vaccine? Yes  Zostavax completed 03/11/12. Due for Shingrix. Education has been provided regarding the importance of this vaccine. Pt has been advised to call insurance company to determine out of pocket expense. Advised may also receive vaccine at local pharmacy or Health Dept. Verbalized acceptance and understanding.  Tdap: Up to date  Pneumococcal Vaccine: Due for Pneumococcal vaccine. Does the patient want to receive this vaccine today?  No . Advised may receive this vaccine at local pharmacy or Health Dept. Aware to  provide a copy of the vaccination record if obtained from local pharmacy or Health Dept. Verbalized acceptance and understanding.   Screening Tests Health Maintenance  Topic Date Due  . PNA vac Low Risk Adult (2 of 2 - PCV13) 03/11/2013  . DEXA SCAN  04/16/2014  . INFLUENZA VACCINE  08/15/2018  . TETANUS/TDAP  01/14/2021  . COLONOSCOPY  06/20/2025    Cancer Screenings:  Colorectal Screening: No longer required.   Mammogram: No longer required.   Bone Density: Completed 04/15/12. Results reflect OSTEOPOROSIS. Repeat every 2 years. Pt declined scheduling a scan at this time.   Lung Cancer Screening: (Low Dose CT Chest recommended if Age 32-80 years, 30 pack-year currently smoking OR have quit w/in 15years.) does not qualify.   Additional Screening:  Vision Screening: Recommended annual ophthalmology exams for early detection of glaucoma and other disorders of the eye.  Dental Screening: Recommended annual dental exams for proper oral hygiene  Community Resource Referral:  CRR required this visit?  No       Plan:  I have personally reviewed and addressed the Medicare Annual Wellness questionnaire and have noted the following in the patient's chart:  A. Medical and social history B. Use of alcohol, tobacco or illicit drugs  C. Current medications and  supplements D. Functional ability and status E.  Nutritional status F.  Physical activity G. Advance directives H. List of other physicians I.  Hospitalizations, surgeries, and ER visits in previous 12 months J.  [place] such as hearing and vision if needed, cognitive and depression L. Referrals and appointments   In addition, I have reviewed and discussed with patient certain preventive protocols, quality metrics, and best practice recommendations. A written personalized care plan for preventive services as well as general preventive health recommendations were provided to patient. Nurse Health Advisor  Signed,    Jakyrah Holladay [place], [place]  4/54/0981 Nurse Health Advisor   Nurse Notes: Pt declined a DEXA order today or a future order for the Prevnar 13 vaccine.

## 2018-05-27 NOTE — Patient Instructions (Signed)
[name] , Thank you for taking time to come for your Medicare Wellness Visit. I appreciate your ongoing commitment to your health goals. Please review the following plan we discussed and let me know if I can assist you in the future.   Screening recommendations/referrals: Colonoscopy: No longer required.  Mammogram: No longer required.  Bone Density: Pt declined a referral today.  Recommended yearly ophthalmology/optometry visit for glaucoma screening and checkup Recommended yearly dental visit for hygiene and checkup  Vaccinations: Influenza vaccine: N/A Pneumococcal vaccine: Pt declines today.  Tdap vaccine: Up to date, due 01/2021 Shingles vaccine: Pt declines today.     Advanced directives: Currently on file.   Conditions/risks identified: Continue to increase water intake to 6-8 8 oz glasses a day.   Next appointment: None. Pt declined scheduling a CPE with PCP at this time or scheduling her AWV for 2021.    Preventive Care 39 Years and Older, Female Preventive care refers to lifestyle choices and visits with your health care provider that can promote health and wellness. What does preventive care include?  A yearly physical exam. This is also called an annual well check.  Dental exams once or twice a year.  Routine eye exams. Ask your health care provider how often you should have your eyes checked.  Personal lifestyle choices, including:  Daily care of your teeth and gums.  Regular physical activity.  Eating a healthy diet.  Avoiding tobacco and drug use.  Limiting alcohol use.  Practicing safe sex.  Taking low-dose aspirin every day.  Taking vitamin and mineral supplements as recommended by your health care provider. What happens during an annual well check? The services and screenings done by your health care provider during your annual well check will depend on your age, overall health, lifestyle risk factors, and family history of disease. Counseling   Your health care provider may ask you questions about your:  Alcohol use.  Tobacco use.  Drug use.  Emotional well-being.  Home and relationship well-being.  Sexual activity.  Eating habits.  History of falls.  Memory and ability to understand (cognition).  Work and work Statistician.  Reproductive health. Screening  You may have the following tests or measurements:  Height, weight, and BMI.  Blood pressure.  Lipid and cholesterol levels. These may be checked every 5 years, or more frequently if you are over 16 years old.  Skin check.  Lung cancer screening. You may have this screening every year starting at age 16 if you have a 30-pack-year history of smoking and currently smoke or have quit within the past 15 years.  Fecal occult blood test (FOBT) of the stool. You may have this test every year starting at age 83.  Flexible sigmoidoscopy or colonoscopy. You may have a sigmoidoscopy every 5 years or a colonoscopy every 10 years starting at age 9.  Hepatitis C blood test.  Hepatitis B blood test.  Sexually transmitted disease (STD) testing.  Diabetes screening. This is done by checking your blood sugar (glucose) after you have not eaten for a while (fasting). You may have this done every 1-3 years.  Bone density scan. This is done to screen for osteoporosis. You may have this done starting at age 59.  Mammogram. This may be done every 1-2 years. Talk to your health care provider about how often you should have regular mammograms. Talk with your health care provider about your test results, treatment options, and if necessary, the need for more tests. Vaccines  Your health care provider may recommend certain vaccines, such as:  Influenza vaccine. This is recommended every year.  Tetanus, diphtheria, and acellular pertussis (Tdap, Td) vaccine. You may need a Td booster every 10 years.  Zoster vaccine. You may need this after age 17.  Pneumococcal 13-valent  conjugate (PCV13) vaccine. One dose is recommended after age 14.  Pneumococcal polysaccharide (PPSV23) vaccine. One dose is recommended after age 55. Talk to your health care provider about which screenings and vaccines you need and how often you need them. This information is not intended to replace advice given to you by your health care provider. Make sure you discuss any questions you have with your health care provider. Document Released: 01/27/2015 Document Revised: 09/20/2015 Document Reviewed: 11/01/2014 Elsevier Interactive Patient Education  2017 [place] Prevention in the Home Falls can cause injuries. They can happen to people of all ages. There are many things you can do to make your home safe and to help prevent falls. What can I do on the outside of my home?  Regularly fix the edges of walkways and driveways and fix any cracks.  Remove anything that might make you trip as you walk through a door, such as a raised step or threshold.  Trim any bushes or trees on the path to your home.  Use bright outdoor lighting.  Clear any walking paths of anything that might make someone trip, such as rocks or tools.  Regularly check to see if handrails are loose or broken. Make sure that both sides of any steps have handrails.  Any raised decks and porches should have guardrails on the edges.  Have any leaves, snow, or ice cleared regularly.  Use sand or salt on walking paths during winter.  Clean up any spills in your garage right away. This includes oil or grease spills. What can I do in the bathroom?  Use night lights.  Install grab bars by the toilet and in the tub and shower. Do not use towel bars as grab bars.  Use non-skid mats or decals in the tub or shower.  If you need to sit down in the shower, use a plastic, non-slip stool.  Keep the floor dry. Clean up any water that spills on the floor as soon as it happens.  Remove soap buildup in the tub or  shower regularly.  Attach bath mats securely with double-sided non-slip rug tape.  Do not have throw rugs and other things on the floor that can make you trip. What can I do in the bedroom?  Use night lights.  Make sure that you have a light by your bed that is easy to reach.  Do not use any sheets or blankets that are too big for your bed. They should not hang down onto the floor.  Have a firm chair that has side arms. You can use this for support while you get dressed.  Do not have throw rugs and other things on the floor that can make you trip. What can I do in the kitchen?  Clean up any spills right away.  Avoid walking on wet floors.  Keep items that you use a lot in easy-to-reach places.  If you need to reach something above you, use a strong step stool that has a grab bar.  Keep electrical cords out of the way.  Do not use floor polish or wax that makes floors slippery. If you must use wax, use non-skid floor wax.  Do  not have throw rugs and other things on the floor that can make you trip. What can I do with my stairs?  Do not leave any items on the stairs.  Make sure that there are handrails on both sides of the stairs and use them. Fix handrails that are broken or loose. Make sure that handrails are as long as the stairways.  Check any carpeting to make sure that it is firmly attached to the stairs. Fix any carpet that is loose or worn.  Avoid having throw rugs at the top or bottom of the stairs. If you do have throw rugs, attach them to the floor with carpet tape.  Make sure that you have a light switch at the top of the stairs and the bottom of the stairs. If you do not have them, ask someone to add them for you. What else can I do to help prevent falls?  Wear shoes that:  Do not have high heels.  Have rubber bottoms.  Are comfortable and fit you well.  Are closed at the toe. Do not wear sandals.  If you use a stepladder:  Make sure that it is fully  opened. Do not climb a closed stepladder.  Make sure that both sides of the stepladder are locked into place.  Ask someone to hold it for you, if possible.  Clearly mark and make sure that you can see:  Any grab bars or handrails.  First and last steps.  Where the edge of each step is.  Use tools that help you move around (mobility aids) if they are needed. These include:  Canes.  Walkers.  Scooters.  Crutches.  Turn on the lights when you go into a dark area. Replace any light bulbs as soon as they burn out.  Set up your furniture so you have a clear path. Avoid moving your furniture around.  If any of your floors are uneven, fix them.  If there are any pets around you, be aware of where they are.  Review your medicines with your doctor. Some medicines can make you feel dizzy. This can increase your chance of falling. Ask your doctor what other things that you can do to help prevent falls. This information is not intended to replace advice given to you by your health care provider. Make sure you discuss any questions you have with your health care provider. Document Released: 10/27/2008 Document Revised: 06/08/2015 Document Reviewed: 02/04/2014 Elsevier Interactive Patient Education  2017 [employer].

## 2018-06-23 ENCOUNTER — Other Ambulatory Visit: Payer: Self-pay

## 2018-06-23 ENCOUNTER — Ambulatory Visit
Admission: RE | Admit: 2018-06-23 | Discharge: 2018-06-23 | Disposition: A | Discharge status: Home / Self Care | Payer: PPO | Source: Ambulatory Visit | Attending: Physician Assistant | Admitting: Physician Assistant

## 2018-06-23 DIAGNOSIS — R311 Benign essential microscopic hematuria: Secondary | ICD-10-CM | POA: Diagnosis not present

## 2018-06-23 DIAGNOSIS — D3001 Benign neoplasm of right kidney: Secondary | ICD-10-CM | POA: Insufficient documentation

## 2018-06-23 DIAGNOSIS — N2889 Other specified disorders of kidney and ureter: Secondary | ICD-10-CM | POA: Diagnosis not present

## 2018-06-23 DIAGNOSIS — D1771 Benign lipomatous neoplasm of kidney: Secondary | ICD-10-CM

## 2018-06-23 DIAGNOSIS — N3289 Other specified disorders of bladder: Secondary | ICD-10-CM | POA: Diagnosis not present

## 2018-06-24 ENCOUNTER — Telehealth: Payer: Self-pay | Admitting: *Deleted

## 2018-06-24 NOTE — Telephone Encounter (Signed)
-----   Message from Mar Daring, [place] sent at 06/23/2018  8:40 PM EDT ----- Angiomyolipoma of the right kidney is stable and unchanged when compared to CT from 2010. Most likely benign and would not require further imaging.

## 2018-06-24 NOTE — Telephone Encounter (Signed)
Patient advised as below.  

## 2018-06-24 NOTE — Telephone Encounter (Signed)
LMOVM for pt to return call 

## 2018-06-24 NOTE — Telephone Encounter (Signed)
Pt returned call ° °teri °

## 2018-10-26 DIAGNOSIS — H40003 Preglaucoma, unspecified, bilateral: Secondary | ICD-10-CM | POA: Diagnosis not present

## 2019-06-01 NOTE — Progress Notes (Signed)
Subjective:   Morgan Oneill is a 79 y.o. female who presents for Medicare Annual (Subsequent) preventive examination.  I connected with Morgan Oneill today by telephone and verified that I am speaking with the correct person using two identifiers. Location patient: home Location provider: work Persons participating in the virtual visit: patient, provider.   I discussed the limitations, risks, security and privacy concerns of performing an evaluation and management service by telephone and the availability of in person appointments. I also discussed with the patient that there may be a patient responsible charge related to this service. The patient expressed understanding and verbally consented to this telephonic visit.    Interactive audio and video telecommunications were attempted between this provider and patient, however failed, due to patient having technical difficulties OR patient did not have access to video capability.  We continued and completed visit with audio only.    Review of Systems:  N/A  Cardiac Risk Factors include: advanced age (>29men, >85 women);dyslipidemia     Objective:     Vitals: There were no vitals taken for this visit.  There is no height or weight on file to calculate BMI.  Advanced Directives 06/02/2019 05/27/2018 05/09/2017 06/21/2015  Does Patient Have a Medical Advance Directive? Yes Yes Yes No  Type of Paramedic of [place];Living will [place];Living will [place] in Chart? No - copy requested Yes - validated most recent copy scanned in chart (See row information) No - copy requested -    Tobacco Social History   Tobacco Use  Smoking Status Never Smoker  Smokeless Tobacco Never Used     Counseling given: Not Answered   Clinical Intake:  Pre-visit preparation completed: Yes  Pain : No/denies pain Pain Score: 0-No pain      Nutritional Risks: None Diabetes: No  How often do you need to have someone help you when you read instructions, pamphlets, or other written materials from your doctor or pharmacy?: 1 - Never  Interpreter Needed?: No  Information entered by :: [facility], LPN  Past Medical History:  Diagnosis Date  . Hematuria   . Hyperlipidemia   . Osteopenia   . Skin cancer    Past Surgical History:  Procedure Laterality Date  . ABDOMINOPLASTY    . BUNIONECTOMY Bilateral 1980  . COLONOSCOPY    . COLONOSCOPY WITH PROPOFOL N/A 06/21/2015   Procedure: COLONOSCOPY WITH PROPOFOL;  Surgeon: Manya Silvas, MD;  [facility] ENDOSCOPY;  Service: Endoscopy;  Laterality: N/A;  . FRACTURE SURGERY Left 2009   Family History  Problem Relation Age of Onset  . Cancer Father        lung ; smoker and alcoholic  . Colon cancer Brother    Social History   Socioeconomic History  . Marital status: Married    Spouse name: Not on file  . Number of children: 1  . Years of education: Not on file  . Highest education level: GED or equivalent  Occupational History  . Occupation: alterations    Comment: full time  Tobacco Use  . Smoking status: Never Smoker  . Smokeless tobacco: Never Used  Substance and Sexual Activity  . Alcohol use: No  . Drug use: No  . Sexual activity: Not on file  Other Topics Concern  . Not on file  Social History Narrative  . Not on file   Social Determinants of Health   Financial  Resource Strain: Low Risk   . Difficulty of Paying Living Expenses: Not hard at all  Food Insecurity: No Food Insecurity  . Worried About Charity fundraiser in the Last Year: Never true  . Ran Out of Food in the Last Year: Never true  Transportation Needs: No Transportation Needs  . Lack of Transportation (Medical): No  . Lack of Transportation (Non-Medical): No  Physical Activity: Inactive  . Days of Exercise per Week: 0 days  . Minutes of Exercise per Session: 0 min  Stress: No  Stress Concern Present  . Feeling of Stress : Not at all  Social Connections: Slightly Isolated  . Frequency of Communication with Friends and Family: Twice a week  . Frequency of Social Gatherings with Friends and Family: More than three times a week  . Attends Religious Services: More than 4 times per year  . Active Member of Clubs or Organizations: No  . Attends Archivist Meetings: Never  . Marital Status: Married    Outpatient Encounter Medications as of 06/02/2019  Medication Sig  . ibuprofen (ADVIL) 200 MG tablet Take 200 mg by mouth every 6 (six) hours as needed.  . doxycycline (VIBRA-TABS) 100 MG tablet Take 1 tablet (100 mg total) by mouth 2 (two) times daily. (Patient not taking: Reported on 05/27/2018)   No facility-administered encounter medications on file as of 06/02/2019.    Activities of Daily Living In your present state of health, do you have any difficulty performing the following activities: 06/02/2019  Hearing? N  Vision? N  Difficulty concentrating or making decisions? N  Walking or climbing stairs? N  Dressing or bathing? N  Doing errands, shopping? N  Preparing Food and eating ? N  Using the Toilet? N  In the past six months, have you accidently leaked urine? Y  Comment Wears protection daily.  Do you have problems with loss of bowel control? N  Managing your Medications? N  Managing your Finances? N  Housekeeping or managing your Housekeeping? N  Some recent data might be hidden    Patient Care Team: Rubye Beach as PCP - General (Family Medicine)    Assessment:   This is a routine wellness examination for [name].  Exercise Activities and Dietary recommendations Current Exercise Habits: Home exercise routine, Type of exercise: walking, Time (Minutes): 35, Frequency (Times/Week): 3, Weekly Exercise (Minutes/Week): 105, Intensity: Mild, Exercise limited by: None identified  Goals    . DIET - INCREASE WATER INTAKE      Recommend increasing water intake to 4 glasses a day.        Fall Risk: Fall Risk  06/02/2019 05/27/2018 05/09/2017  Falls in the past year? 0 0 No  Number falls in past yr: 0 - -  Injury with Fall? 0 - -    FALL RISK PREVENTION PERTAINING TO THE HOME:  Any stairs in or around the home? Yes  If so, are there any without handrails? No   Home free of loose throw rugs in walkways, pet beds, electrical cords, etc? Yes  Adequate lighting in your home to reduce risk of falls? Yes   ASSISTIVE DEVICES UTILIZED TO PREVENT FALLS:  Life alert? No  Use of a cane, walker or w/c? No  Grab bars in the bathroom? Yes  Shower chair or bench in shower? No  Elevated toilet seat or a handicapped toilet? Yes    TIMED UP AND GO:  Was the test performed? No .  Depression Screen PHQ 2/9 Scores 06/02/2019 05/27/2018 05/27/2018 05/09/2017  PHQ - 2 Score 0 0 0 0  PHQ- 9 Score - 0 - 0     Cognitive Function: Declined today.         Immunization History  Administered Date(s) Administered  . PFIZER SARS-COV-2 Vaccination 01/19/2019, 02/09/2019  . Pneumococcal Polysaccharide-23 03/11/2012  . Tdap 01/15/2011  . Zoster 03/11/2012    Qualifies for Shingles Vaccine? Yes  Zostavax completed 03/11/12. Due for Shingrix. Pt has been advised to call insurance company to determine out of pocket expense. Advised may also receive vaccine at local pharmacy or Health Dept. Verbalized acceptance and understanding.  Tdap: Up to date  Flu Vaccine: Due fall 2021.  Pneumococcal Vaccine: Due for Pneumococcal vaccine. Does the patient want to receive this vaccine today?  No . Advised may receive this vaccine at local pharmacy or Health Dept. Aware to provide a copy of the vaccination record if obtained from local pharmacy or Health Dept. Verbalized acceptance and understanding.   Screening Tests Health Maintenance  Topic Date Due  . PNA vac Low Risk Adult (2 of 2 - PCV13) 03/11/2013  . DEXA SCAN  04/16/2014   . INFLUENZA VACCINE  08/15/2019  . TETANUS/TDAP  01/14/2021  . COVID-19 Vaccine  Completed    Cancer Screenings:  Colorectal Screening: No longer required.   Mammogram: No longer required.   Bone Density: Completed 04/15/12. Results reflect OSTEOPOROSIS. Repeat every 2 years. Declined order today.  Lung Cancer Screening: (Low Dose CT Chest recommended if Age 83-80 years, 30 pack-year currently smoking OR have quit w/in 15years.) does not qualify.   Additional Screening:  Vision Screening: Recommended annual ophthalmology exams for early detection of glaucoma and other disorders of the eye.  Dental Screening: Recommended annual dental exams for proper oral hygiene  Community Resource Referral:  CRR required this visit? No     Plan:  I have personally reviewed and addressed the Medicare Annual Wellness questionnaire and have noted the following in the patient's chart:  A. Medical and social history B. Use of alcohol, tobacco or illicit drugs  C. Current medications and supplements D. Functional ability and status E.  Nutritional status F.  Physical activity G. Advance directives H. List of other physicians I.  Hospitalizations, surgeries, and ER visits in previous 12 months J.  [place] such as hearing and vision if needed, cognitive and depression L. Referrals and appointments   In addition, I have reviewed and discussed with patient certain preventive protocols, quality metrics, and best practice recommendations. A written personalized care plan for preventive services as well as general preventive health recommendations were provided to patient. Nurse Health Advisor  Signed,    Nikeria Kalman [place], [place]  [id number] Nurse Health Advisor   Nurse Notes: Pt declined receiving a future pneumonia vaccine in office to complete the series. Pt also declined a DEXA scan order. Pt requested a colonoscopy referral due to her brother having colon cancer. Referral  sent.

## 2019-06-02 ENCOUNTER — Ambulatory Visit (INDEPENDENT_AMBULATORY_CARE_PROVIDER_SITE_OTHER): Payer: PPO

## 2019-06-02 ENCOUNTER — Other Ambulatory Visit: Payer: Self-pay

## 2019-06-02 DIAGNOSIS — Z1211 Encounter for screening for malignant neoplasm of colon: Secondary | ICD-10-CM

## 2019-06-02 DIAGNOSIS — Z0000 Encounter for general adult medical examination without abnormal findings: Secondary | ICD-10-CM | POA: Diagnosis not present

## 2019-06-02 NOTE — Patient Instructions (Signed)
Morgan Oneill , Thank you for taking time to come for your Medicare Wellness Visit. I appreciate your ongoing commitment to your health goals. Please review the following plan we discussed and let me know if I can assist you in the future.   Screening recommendations/referrals: Colonoscopy: No longer required. Requested referral- sent today. Mammogram: No longer required.  Bone Density: Currently due. Declined order today. Recommended yearly ophthalmology/optometry visit for glaucoma screening and checkup Recommended yearly dental visit for hygiene and checkup  Vaccinations: Influenza vaccine: Due fall 2021 Pneumococcal vaccine: Prevnar 13 due. Pt declined a future order.  Tdap vaccine: Up to date, due 01/2021 Shingles vaccine: Pt declines today.     Advanced directives: Currently on file.  Conditions/risks identified: Recommend increasing water intake to 6-8 8 oz glasses a day.  Next appointment: 06/16/19 @ 9:00 AM with [place] 65 Years and Older, Female Preventive care refers to lifestyle choices and visits with your health care provider that can promote health and wellness. What does preventive care include?  A yearly physical exam. This is also called an annual well check.  Dental exams once or twice a year.  Routine eye exams. Ask your health care provider how often you should have your eyes checked.  Personal lifestyle choices, including:  Daily care of your teeth and gums.  Regular physical activity.  Eating a healthy diet.  Avoiding tobacco and drug use.  Limiting alcohol use.  Practicing safe sex.  Taking low-dose aspirin every day.  Taking vitamin and mineral supplements as recommended by your health care provider. What happens during an annual well check? The services and screenings done by your health care provider during your annual well check will depend on your age, overall health, lifestyle risk factors, and family history of  disease. Counseling  Your health care provider may ask you questions about your:  Alcohol use.  Tobacco use.  Drug use.  Emotional well-being.  Home and relationship well-being.  Sexual activity.  Eating habits.  History of falls.  Memory and ability to understand (cognition).  Work and work Statistician.  Reproductive health. Screening  You may have the following tests or measurements:  Height, weight, and BMI.  Blood pressure.  Lipid and cholesterol levels. These may be checked every 5 years, or more frequently if you are over 49 years old.  Skin check.  Lung cancer screening. You may have this screening every year starting at age 53 if you have a 30-pack-year history of smoking and currently smoke or have quit within the past 15 years.  Fecal occult blood test (FOBT) of the stool. You may have this test every year starting at age 51.  Flexible sigmoidoscopy or colonoscopy. You may have a sigmoidoscopy every 5 years or a colonoscopy every 10 years starting at age 90.  Hepatitis C blood test.  Hepatitis B blood test.  Sexually transmitted disease (STD) testing.  Diabetes screening. This is done by checking your blood sugar (glucose) after you have not eaten for a while (fasting). You may have this done every 1-3 years.  Bone density scan. This is done to screen for osteoporosis. You may have this done starting at age 61.  Mammogram. This may be done every 1-2 years. Talk to your health care provider about how often you should have regular mammograms. Talk with your health care provider about your test results, treatment options, and if necessary, the need for more tests. Vaccines  Your health care provider  may recommend certain vaccines, such as:  Influenza vaccine. This is recommended every year.  Tetanus, diphtheria, and acellular pertussis (Tdap, Td) vaccine. You may need a Td booster every 10 years.  Zoster vaccine. You may need this after age  83.  Pneumococcal 13-valent conjugate (PCV13) vaccine. One dose is recommended after age 14.  Pneumococcal polysaccharide (PPSV23) vaccine. One dose is recommended after age 78. Talk to your health care provider about which screenings and vaccines you need and how often you need them. This information is not intended to replace advice given to you by your health care provider. Make sure you discuss any questions you have with your health care provider. Document Released: 01/27/2015 Document Revised: 09/20/2015 Document Reviewed: 11/01/2014 Elsevier Interactive Patient Education  2017 [place] Prevention in the Home Falls can cause injuries. They can happen to people of all ages. There are many things you can do to make your home safe and to help prevent falls. What can I do on the outside of my home?  Regularly fix the edges of walkways and driveways and fix any cracks.  Remove anything that might make you trip as you walk through a door, such as a raised step or threshold.  Trim any bushes or trees on the path to your home.  Use bright outdoor lighting.  Clear any walking paths of anything that might make someone trip, such as rocks or tools.  Regularly check to see if handrails are loose or broken. Make sure that both sides of any steps have handrails.  Any raised decks and porches should have guardrails on the edges.  Have any leaves, snow, or ice cleared regularly.  Use sand or salt on walking paths during winter.  Clean up any spills in your garage right away. This includes oil or grease spills. What can I do in the bathroom?  Use night lights.  Install grab bars by the toilet and in the tub and shower. Do not use towel bars as grab bars.  Use non-skid mats or decals in the tub or shower.  If you need to sit down in the shower, use a plastic, non-slip stool.  Keep the floor dry. Clean up any water that spills on the floor as soon as it happens.  Remove  soap buildup in the tub or shower regularly.  Attach bath mats securely with double-sided non-slip rug tape.  Do not have throw rugs and other things on the floor that can make you trip. What can I do in the bedroom?  Use night lights.  Make sure that you have a light by your bed that is easy to reach.  Do not use any sheets or blankets that are too big for your bed. They should not hang down onto the floor.  Have a firm chair that has side arms. You can use this for support while you get dressed.  Do not have throw rugs and other things on the floor that can make you trip. What can I do in the kitchen?  Clean up any spills right away.  Avoid walking on wet floors.  Keep items that you use a lot in easy-to-reach places.  If you need to reach something above you, use a strong step stool that has a grab bar.  Keep electrical cords out of the way.  Do not use floor polish or wax that makes floors slippery. If you must use wax, use non-skid floor wax.  Do not have throw rugs  and other things on the floor that can make you trip. What can I do with my stairs?  Do not leave any items on the stairs.  Make sure that there are handrails on both sides of the stairs and use them. Fix handrails that are broken or loose. Make sure that handrails are as long as the stairways.  Check any carpeting to make sure that it is firmly attached to the stairs. Fix any carpet that is loose or worn.  Avoid having throw rugs at the top or bottom of the stairs. If you do have throw rugs, attach them to the floor with carpet tape.  Make sure that you have a light switch at the top of the stairs and the bottom of the stairs. If you do not have them, ask someone to add them for you. What else can I do to help prevent falls?  Wear shoes that:  Do not have high heels.  Have rubber bottoms.  Are comfortable and fit you well.  Are closed at the toe. Do not wear sandals.  If you use a  stepladder:  Make sure that it is fully opened. Do not climb a closed stepladder.  Make sure that both sides of the stepladder are locked into place.  Ask someone to hold it for you, if possible.  Clearly mark and make sure that you can see:  Any grab bars or handrails.  First and last steps.  Where the edge of each step is.  Use tools that help you move around (mobility aids) if they are needed. These include:  Canes.  Walkers.  Scooters.  Crutches.  Turn on the lights when you go into a dark area. Replace any light bulbs as soon as they burn out.  Set up your furniture so you have a clear path. Avoid moving your furniture around.  If any of your floors are uneven, fix them.  If there are any pets around you, be aware of where they are.  Review your medicines with your doctor. Some medicines can make you feel dizzy. This can increase your chance of falling. Ask your doctor what other things that you can do to help prevent falls. This information is not intended to replace advice given to you by your health care provider. Make sure you discuss any questions you have with your health care provider. Document Released: 10/27/2008 Document Revised: 06/08/2015 Document Reviewed: 02/04/2014 Elsevier Interactive Patient Education  2017 [employer].

## 2019-06-15 ENCOUNTER — Encounter: Payer: PPO | Admitting: Physician Assistant

## 2019-06-16 ENCOUNTER — Encounter: Payer: Self-pay | Admitting: Physician Assistant

## 2019-06-16 ENCOUNTER — Ambulatory Visit (INDEPENDENT_AMBULATORY_CARE_PROVIDER_SITE_OTHER): Payer: PPO | Admitting: Physician Assistant

## 2019-06-16 ENCOUNTER — Other Ambulatory Visit: Payer: Self-pay

## 2019-06-16 VITALS — BP 134/72 | HR 67 | Temp 97.1°F | Resp 16 | Ht 65.5 in | Wt 144.2 lb

## 2019-06-16 DIAGNOSIS — Z13220 Encounter for screening for lipoid disorders: Secondary | ICD-10-CM

## 2019-06-16 DIAGNOSIS — Z1211 Encounter for screening for malignant neoplasm of colon: Secondary | ICD-10-CM | POA: Diagnosis not present

## 2019-06-16 DIAGNOSIS — Z136 Encounter for screening for cardiovascular disorders: Secondary | ICD-10-CM

## 2019-06-16 DIAGNOSIS — Z1239 Encounter for other screening for malignant neoplasm of breast: Secondary | ICD-10-CM | POA: Diagnosis not present

## 2019-06-16 DIAGNOSIS — Z800 Family history of malignant neoplasm of digestive organs: Secondary | ICD-10-CM

## 2019-06-16 DIAGNOSIS — Z0000 Encounter for general adult medical examination without abnormal findings: Secondary | ICD-10-CM

## 2019-06-16 NOTE — Progress Notes (Signed)
Complete physical exam   Patient: Morgan Oneill   DOB: [birth year]   79 y.o. Female  MRN: [id number] Visit Date: 06/16/2019  Today's healthcare provider: Mar Daring, PA-C   Chief Complaint  Patient presents with  . Annual Exam   Subjective    Morgan Oneill is a 79 y.o. female who presents today for a complete physical exam.  She reports consuming a general diet. Current Exercise Habits: Home exercise routine, Type of exercise: walking, Time (Minutes): 35, Frequency (Times/Week): 3  She generally feels well. She reports sleeping well. She does not have additional problems to discuss today.  HPI  Patient had her AWV with [facility] on 06/02/2019   Past Medical History:  Diagnosis Date  . Hematuria   . Hyperlipidemia   . Osteopenia   . Skin cancer    Past Surgical History:  Procedure Laterality Date  . ABDOMINOPLASTY    . BUNIONECTOMY Bilateral 1980  . COLONOSCOPY    . COLONOSCOPY WITH PROPOFOL N/A 06/21/2015   Procedure: COLONOSCOPY WITH PROPOFOL;  Surgeon: Manya Silvas, MD;  Location[facility] ENDOSCOPY;  Service: Endoscopy;  Laterality: N/A;  . FRACTURE SURGERY Left 2009   Social History   Socioeconomic History  . Marital status: Married    Spouse name: Not on file  . Number of children: 1  . Years of education: Not on file  . Highest education level: GED or equivalent  Occupational History  . Occupation: alterations    Comment: full time  Tobacco Use  . Smoking status: Never Smoker  . Smokeless tobacco: Never Used  Substance and Sexual Activity  . Alcohol use: No  . Drug use: No  . Sexual activity: Not on file  Other Topics Concern  . Not on file  Social History Narrative  . Not on file   Social Determinants of Health   Financial Resource Strain: Low Risk   . Difficulty of Paying Living Expenses: Not hard at all  Food Insecurity: No Food Insecurity  . Worried About Charity fundraiser in the Last Year: Never true  . Ran Out of Food in the Last  Year: Never true  Transportation Needs: No Transportation Needs  . Lack of Transportation (Medical): No  . Lack of Transportation (Non-Medical): No  Physical Activity: Inactive  . Days of Exercise per Week: 0 days  . Minutes of Exercise per Session: 0 min  Stress: No Stress Concern Present  . Feeling of Stress : Not at all  Social Connections: Slightly Isolated  . Frequency of Communication with Friends and Family: Twice a week  . Frequency of Social Gatherings with Friends and Family: More than three times a week  . Attends Religious Services: More than 4 times per year  . Active Member of Clubs or Organizations: No  . Attends Archivist Meetings: Never  . Marital Status: Married  Human resources officer Violence: Not At Risk  . Fear of Current or Ex-Partner: No  . Emotionally Abused: No  . Physically Abused: No  . Sexually Abused: No   Family Status  Relation Name Status  . Mother  Deceased       died from old age  . Father  Deceased  . Sister  Alive  . Brother  Deceased       died from an accident  . Brother  Alive  . Brother  Alive   Family History  Problem Relation Age of Onset  . Cancer Father  lung ; smoker and alcoholic  . Colon cancer Brother    No Known Allergies  Patient Care Team: Mar Daring, PA-C as PCP - General (Family Medicine)   Medications: Outpatient Medications Prior to Visit  Medication Sig  . ibuprofen (ADVIL) 200 MG tablet Take 200 mg by mouth every 6 (six) hours as needed.  . doxycycline (VIBRA-TABS) 100 MG tablet Take 1 tablet (100 mg total) by mouth 2 (two) times daily. (Patient not taking: Reported on 05/27/2018)   No facility-administered medications prior to visit.    Review of Systems  Constitutional: Negative.   HENT: Negative.   Eyes: Negative.   Respiratory: Negative.   Cardiovascular: Negative.   Gastrointestinal: Negative.   Endocrine: Negative.   Genitourinary: Positive for enuresis.  Musculoskeletal:  Negative.   Skin: Negative.   Allergic/Immunologic: Negative.   Neurological: Negative.   Hematological: Negative.   Psychiatric/Behavioral: Negative.     Last CBC Lab Results  Component Value Date   WBC 6.4 05/11/2018   HGB 13.3 05/11/2018   HCT 38.1 05/11/2018   MCV 85 05/11/2018   MCH 29.8 05/11/2018   RDW 13.0 05/11/2018   PLT 185 [id number]   Last metabolic panel Lab Results  Component Value Date   GLUCOSE 86 05/11/2018   NA 138 05/11/2018   K 4.4 05/11/2018   CL 102 05/11/2018   CO2 24 05/11/2018   BUN 20 05/11/2018   CREATININE 1.00 05/11/2018   GFRNONAA 55 (L) 05/11/2018   GFRAA 64 05/11/2018   CALCIUM 9.4 05/11/2018   PROT 6.7 05/11/2018   ALBUMIN 4.5 05/11/2018   LABGLOB 2.2 05/11/2018   AGRATIO 2.0 05/11/2018   BILITOT 0.5 05/11/2018   ALKPHOS 67 05/11/2018   AST 26 05/11/2018   ALT 16 05/11/2018      Objective    BP 134/72 (BP Location: Left Arm, Patient Position: Sitting, Cuff Size: Normal)   Pulse 67   Temp (!) 97.1 F (36.2 C) (Temporal)   Resp 16   Ht 5' 5.5" (1.664 m)   Wt 144 lb 3.2 oz (65.4 kg)   BMI 23.63 kg/m  BP Readings from Last 3 Encounters:  06/16/19 134/72  05/11/18 (!) 145/89  05/09/17 (!) 134/56   Wt Readings from Last 3 Encounters:  06/16/19 144 lb 3.2 oz (65.4 kg)  05/11/18 150 lb (68 kg)  05/09/17 147 lb 6.4 oz (66.9 kg)      Physical Exam Vitals reviewed.  Constitutional:      General: She is not in acute distress.    Appearance: Normal appearance. She is well-developed and normal weight. She is not ill-appearing or diaphoretic.  HENT:     Head: Normocephalic and atraumatic.     Right Ear: Tympanic membrane, ear canal and external ear normal.     Left Ear: Tympanic membrane, ear canal and external ear normal.  Eyes:     General: No scleral icterus.       Right eye: No discharge.        Left eye: No discharge.     Extraocular Movements: Extraocular movements intact.     Conjunctiva/sclera: Conjunctivae  normal.     Pupils: Pupils are equal, round, and reactive to light.  Neck:     Thyroid: No thyromegaly.     Vascular: No carotid bruit or JVD.     Trachea: No tracheal deviation.  Cardiovascular:     Rate and Rhythm: Normal rate and regular rhythm.     Pulses: Normal pulses.  Heart sounds: Normal heart sounds. No murmur. No friction rub. No gallop.   Pulmonary:     Effort: Pulmonary effort is normal. No respiratory distress.     Breath sounds: Normal breath sounds. No wheezing or rales.  Chest:     Chest wall: No tenderness.     Breasts:        Right: Normal.        Left: Normal.     Comments: Bilateral implants present under muscle Abdominal:     General: Abdomen is flat. Bowel sounds are normal. There is no distension.     Palpations: Abdomen is soft. There is no mass.     Tenderness: There is no abdominal tenderness. There is no guarding or rebound.  Musculoskeletal:        General: No tenderness. Normal range of motion.     Cervical back: Normal range of motion and neck supple.     Right lower leg: No edema.     Left lower leg: No edema.  Lymphadenopathy:     Cervical: No cervical adenopathy.  Skin:    General: Skin is warm and dry.     Capillary Refill: Capillary refill takes less than 2 seconds.     Findings: No rash.  Neurological:     General: No focal deficit present.     Mental Status: She is alert and oriented to person, place, and time. Mental status is at baseline.  Psychiatric:        Mood and Affect: Mood normal.        Behavior: Behavior normal.        Thought Content: Thought content normal.        Judgment: Judgment normal.      Fall Risk  06/02/2019 05/27/2018 05/09/2017  Falls in the past year? 0 0 No  Number falls in past yr: 0 - -  Injury with Fall? 0 - -   Depression Screen  PHQ 2/9 Scores 06/02/2019 05/27/2018 05/27/2018  PHQ - 2 Score 0 0 0  PHQ- 9 Score - 0 -   Cognitive Function: Declined 06/02/2019        No results found for any  visits on 06/16/19.  Assessment & Plan    Routine Health Maintenance and Physical Exam  Exercise Activities and Dietary recommendations Goals    . DIET - INCREASE WATER INTAKE     Recommend increasing water intake to 4 glasses a day.        Immunization History  Administered Date(s) Administered  . PFIZER SARS-COV-2 Vaccination 01/19/2019, 02/09/2019  . Pneumococcal Polysaccharide-23 03/11/2012  . Tdap 01/15/2011  . Zoster 03/11/2012    Health Maintenance  Topic Date Due  . PNA vac Low Risk Adult (2 of 2 - PCV13) 03/11/2013  . DEXA SCAN  04/16/2014  . INFLUENZA VACCINE  08/15/2019  . TETANUS/TDAP  01/14/2021  . COVID-19 Vaccine  Completed    Discussed health benefits of physical activity, and encouraged her to engage in regular exercise appropriate for her age and condition.  1. Annual physical exam Normal physical exam today. Will check labs as below and f/u pending lab results. If labs are stable and WNL she will not need to have these rechecked for one year at her next annual physical exam. She is to call the office in the meantime if she has any acute issue, questions or concerns. - CBC with Differential/Platelet - Comprehensive metabolic panel  2. Family history of colon cancer Brother  3. Colon  cancer screening Her last colonoscopy was 6 years ago with Dr. Vira Agar. New referral was placed on 06/02/19. [facility] phone number was given to patient today on AVS.   4. Encounter for breast cancer screening using non-mammogram modality Breast exam today is normal. Patient declines mammogram this year. Will get next year.  5. Encounter for lipid screening for cardiovascular disease Will check labs as below and f/u pending results. - Comprehensive metabolic panel - Lipid panel   No follow-ups on file.     Reynolds Bowl, PA-C, have reviewed all documentation for this visit. The documentation on 06/16/19 for the exam, diagnosis, procedures, and orders are  all accurate and complete.   Rubye Beach  [facility] [phone] (phone) [phone] (fax)  [place]

## 2019-06-16 NOTE — Patient Instructions (Addendum)
[facility]: [place] Maintenance After Age 77 After age 37, you are at a higher risk for certain long-term diseases and infections as well as injuries from falls. Falls are a major cause of broken bones and head injuries in people who are older than age 34. Getting regular preventive care can help to keep you healthy and well. Preventive care includes getting regular testing and making lifestyle changes as recommended by your health care provider. Talk with your health care provider about:  Which screenings and tests you should have. A screening is a test that checks for a disease when you have no symptoms.  A diet and exercise plan that is right for you. What should I know about screenings and tests to prevent falls? Screening and testing are the best ways to find a health problem early. Early diagnosis and treatment give you the best chance of managing medical conditions that are common after age 71. Certain conditions and lifestyle choices may make you more likely to have a fall. Your health care provider may recommend:  Regular vision checks. Poor vision and conditions such as cataracts can make you more likely to have a fall. If you wear glasses, make sure to get your prescription updated if your vision changes.  Medicine review. Work with your health care provider to regularly review all of the medicines you are taking, including over-the-counter medicines. Ask your health care provider about any side effects that may make you more likely to have a fall. Tell your health care provider if any medicines that you take make you feel dizzy or sleepy.  Osteoporosis screening. Osteoporosis is a condition that causes the bones to get weaker. This can make the bones weak and cause them to break more easily.  Blood pressure screening. Blood pressure changes and medicines to control blood pressure can make you feel dizzy.  Strength and balance checks. Your health care  provider may recommend certain tests to check your strength and balance while standing, walking, or changing positions.  Foot health exam. Foot pain and numbness, as well as not wearing proper footwear, can make you more likely to have a fall.  Depression screening. You may be more likely to have a fall if you have a fear of falling, feel emotionally low, or feel unable to do activities that you used to do.  Alcohol use screening. Using too much alcohol can affect your balance and may make you more likely to have a fall. What actions can I take to lower my risk of falls? General instructions  Talk with your health care provider about your risks for falling. Tell your health care provider if: ? You fall. Be sure to tell your health care provider about all falls, even ones that seem minor. ? You feel dizzy, sleepy, or off-balance.  Take over-the-counter and prescription medicines only as told by your health care provider. These include any supplements.  Eat a healthy diet and maintain a healthy weight. A healthy diet includes low-fat dairy products, low-fat (lean) meats, and fiber from whole grains, beans, and lots of fruits and vegetables. Home safety  Remove any tripping hazards, such as rugs, cords, and clutter.  Install safety equipment such as grab bars in bathrooms and safety rails on stairs.  Keep rooms and walkways well-lit. Activity   Follow a regular exercise program to stay fit. This will help you maintain your balance. Ask your health care provider what types of exercise are appropriate for you.  If  you need a cane or walker, use it as recommended by your health care provider.  Wear supportive shoes that have nonskid soles. Lifestyle  Do not drink alcohol if your health care provider tells you not to drink.  If you drink alcohol, limit how much you have: ? 0-1 drink a day for women. ? 0-2 drinks a day for men.  Be aware of how much alcohol is in your drink. In the  [place] one drink equals one typical bottle of beer (12 oz), one-half glass of wine (5 oz), or one shot of hard liquor (1 oz).  Do not use any products that contain nicotine or tobacco, such as cigarettes and e-cigarettes. If you need help quitting, ask your health care provider. Summary  Having a healthy lifestyle and getting preventive care can help to protect your health and wellness after age 55.  Screening and testing are the best way to find a health problem early and help you avoid having a fall. Early diagnosis and treatment give you the best chance for managing medical conditions that are more common for people who are older than age 12.  Falls are a major cause of broken bones and head injuries in people who are older than age 37. Take precautions to prevent a fall at home.  Work with your health care provider to learn what changes you can make to improve your health and wellness and to prevent falls. This information is not intended to replace advice given to you by your health care provider. Make sure you discuss any questions you have with your health care provider. Document Revised: 04/23/2018 Document Reviewed: 11/13/2016 Elsevier Patient Education  2020 [employer].

## 2019-06-17 ENCOUNTER — Telehealth: Payer: Self-pay | Admitting: Physician Assistant

## 2019-06-17 ENCOUNTER — Telehealth: Payer: Self-pay

## 2019-06-17 LAB — LIPID PANEL
Chol/HDL Ratio: 3.6 ratio (ref 0.0–4.4)
Cholesterol, Total: 206 mg/dL — ABNORMAL HIGH (ref 100–199)
HDL: 57 mg/dL (ref >39)
LDL Chol Calc (NIH): 136 mg/dL — ABNORMAL HIGH (ref 0–99)
Triglycerides: 74 mg/dL (ref 0–149)
VLDL Cholesterol Cal: 13 mg/dL (ref 5–40)

## 2019-06-17 LAB — COMPREHENSIVE METABOLIC PANEL
ALT: 19 IU/L (ref 0–32)
AST: 29 IU/L (ref 0–40)
Albumin/Globulin Ratio: 2 (ref 1.2–2.2)
Albumin: 4.5 g/dL (ref 3.7–4.7)
Alkaline Phosphatase: 82 IU/L (ref 48–121)
BUN/Creatinine Ratio: 21 (ref 12–28)
BUN: 19 mg/dL (ref 8–27)
Bilirubin Total: 0.5 mg/dL (ref 0.0–1.2)
CO2: 24 mmol/L (ref 20–29)
Calcium: 9.1 mg/dL (ref 8.7–10.3)
Chloride: 103 mmol/L (ref 96–106)
Creatinine, Ser: 0.91 mg/dL (ref 0.57–1.00)
GFR calc Af Amer: 71 mL/min/{1.73_m2} (ref >59)
GFR calc non Af Amer: 61 mL/min/{1.73_m2} (ref >59)
Globulin, Total: 2.2 g/dL (ref 1.5–4.5)
Glucose: 85 mg/dL (ref 65–99)
Potassium: 4.3 mmol/L (ref 3.5–5.2)
Sodium: 143 mmol/L (ref 134–144)
Total Protein: 6.7 g/dL (ref 6.0–8.5)

## 2019-06-17 LAB — CBC WITH DIFFERENTIAL/PLATELET
Basophils Absolute: 0.1 10*3/uL (ref 0.0–0.2)
Basos: 1 %
EOS (ABSOLUTE): 0.4 10*3/uL (ref 0.0–0.4)
Eos: 6 %
Hematocrit: 39.8 % (ref 34.0–46.6)
Hemoglobin: 13.2 g/dL (ref 11.1–15.9)
Immature Grans (Abs): 0 10*3/uL (ref 0.0–0.1)
Immature Granulocytes: 0 %
Lymphocytes Absolute: 1.7 10*3/uL (ref 0.7–3.1)
Lymphs: 25 %
MCH: 29.7 pg (ref 26.6–33.0)
MCHC: 33.2 g/dL (ref 31.5–35.7)
MCV: 90 fL (ref 79–97)
Monocytes Absolute: 0.6 10*3/uL (ref 0.1–0.9)
Monocytes: 9 %
Neutrophils Absolute: 4 10*3/uL (ref 1.4–7.0)
Neutrophils: 59 %
Platelets: 183 10*3/uL (ref 150–450)
RBC: 4.44 x10E6/uL (ref 3.77–5.28)
RDW: 13 % (ref 11.7–15.4)
WBC: 6.8 10*3/uL (ref 3.4–10.8)

## 2019-06-17 NOTE — Telephone Encounter (Signed)
Patient wants a call when her labs from June 16, 2019 are released. She also wants a copy of them mailed to her home address for her records.  Thanks, [employer]

## 2019-06-17 NOTE — Telephone Encounter (Signed)
-----   Message from Mar Daring, [place] sent at 06/17/2019 10:53 AM EDT ----- Blood count is normal. Kidney and liver function are normal. Sugar is normal. Sodium, potassium and calcium are normal. Cholesterol is borderline high but stable.

## 2019-06-17 NOTE — Telephone Encounter (Signed)
Patient advised as directed below. 

## 2019-06-18 NOTE — Telephone Encounter (Signed)
We are not able to email or mail labs out. We can print a copy of the labs and place them up front like patient was told before.

## 2019-06-30 ENCOUNTER — Telehealth: Payer: PPO

## 2019-08-18 ENCOUNTER — Telehealth: Payer: PPO

## 2019-08-19 DIAGNOSIS — J019 Acute sinusitis, unspecified: Secondary | ICD-10-CM | POA: Diagnosis not present

## 2019-08-19 DIAGNOSIS — H6980 Other specified disorders of Eustachian tube, unspecified ear: Secondary | ICD-10-CM | POA: Diagnosis not present

## 2019-08-19 DIAGNOSIS — B9689 Other specified bacterial agents as the cause of diseases classified elsewhere: Secondary | ICD-10-CM | POA: Diagnosis not present

## 2019-10-01 ENCOUNTER — Telehealth: Payer: Self-pay | Admitting: Gastroenterology

## 2019-10-01 NOTE — Telephone Encounter (Signed)
Patient called to cancel appt due to the rise in COVID cases and will call our office back when she feels safe again. per patient.

## 2019-10-05 ENCOUNTER — Ambulatory Visit: Payer: PPO | Admitting: Gastroenterology

## 2019-10-18 IMAGING — US US RENAL
1 series · 14 of 25 positions shown · non-contrast
Comparison: CT dated 09/29/2008

CLINICAL DATA: Right-sided kidney angiomyolipoma. Benign essential
microscopic hematuria.

EXAM:
RENAL / URINARY TRACT ULTRASOUND COMPLETE

[Series 1: us renal · 0.23mm/px · 14 of 45 slices shown]
[im 1/45]
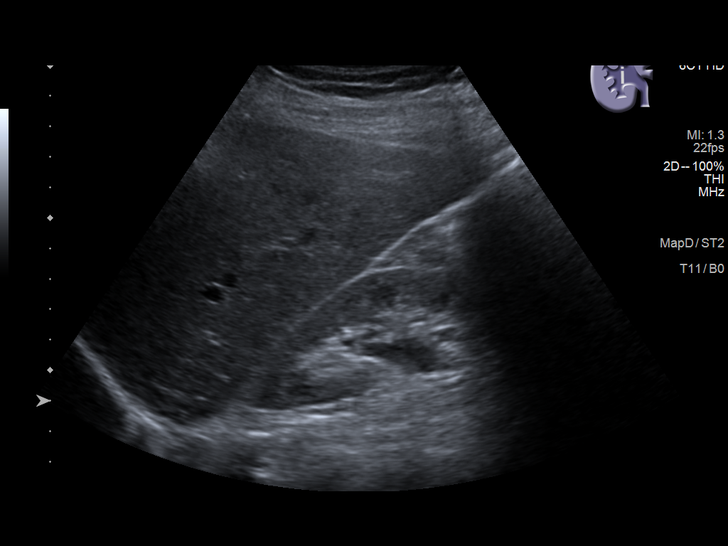
[im 4/45]
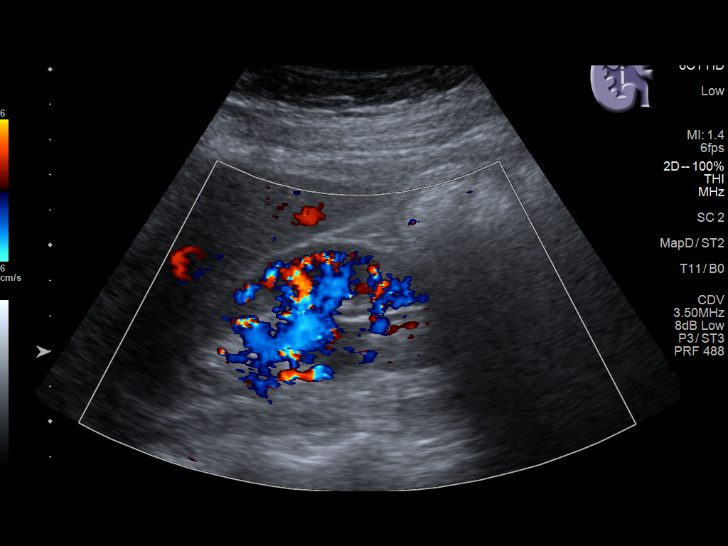
[im 8/45]
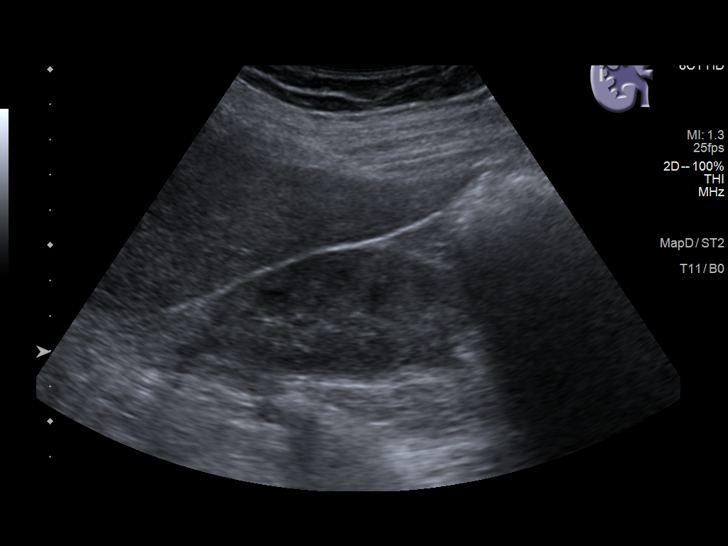
[im 12/45]
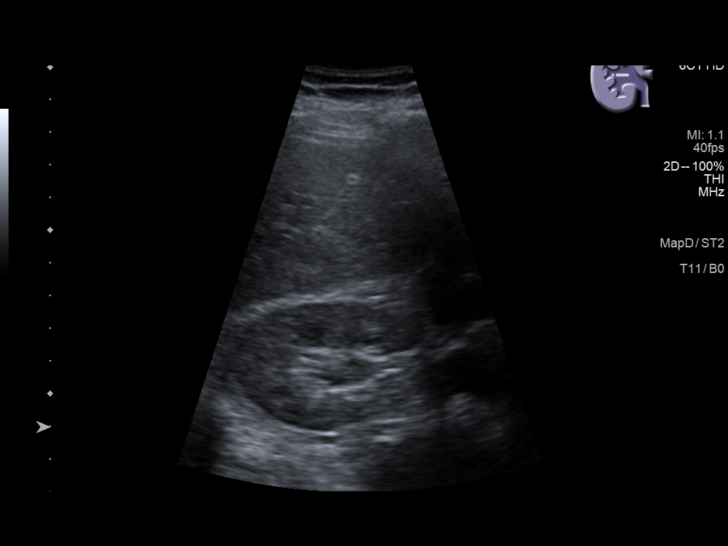
[im 15/45]
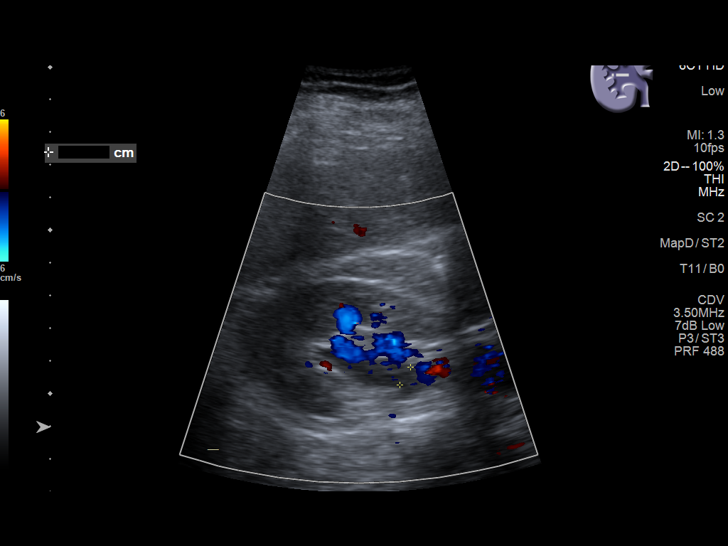
[im 17/45]
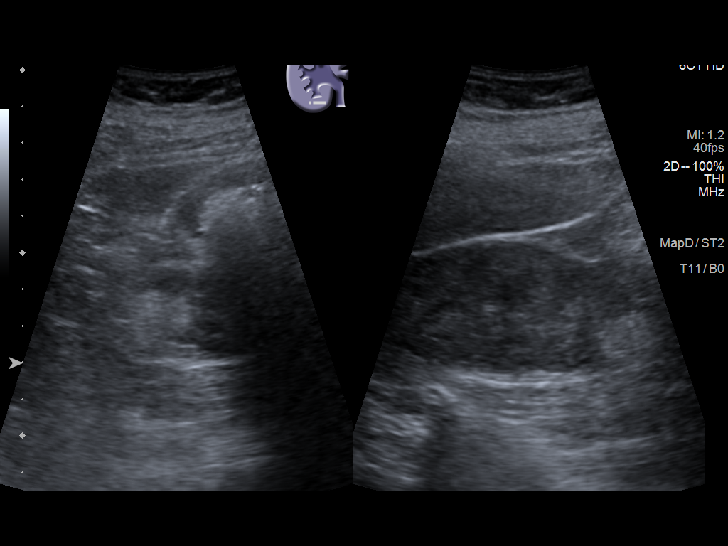
[im 21/45]
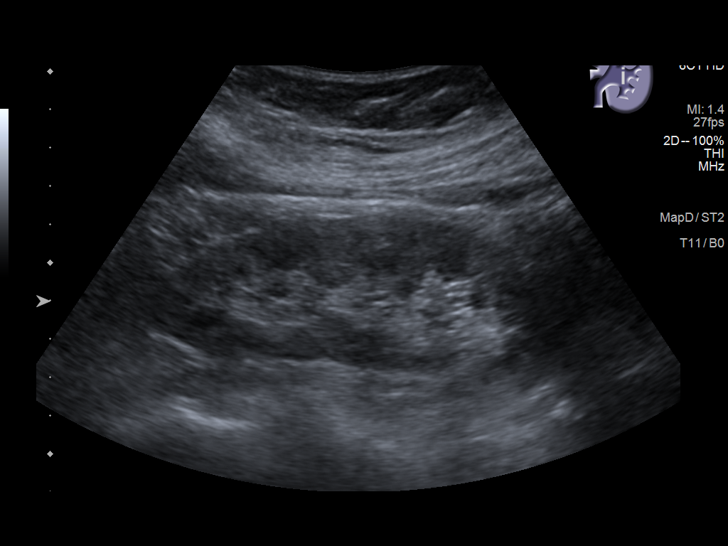
[im 24/45]
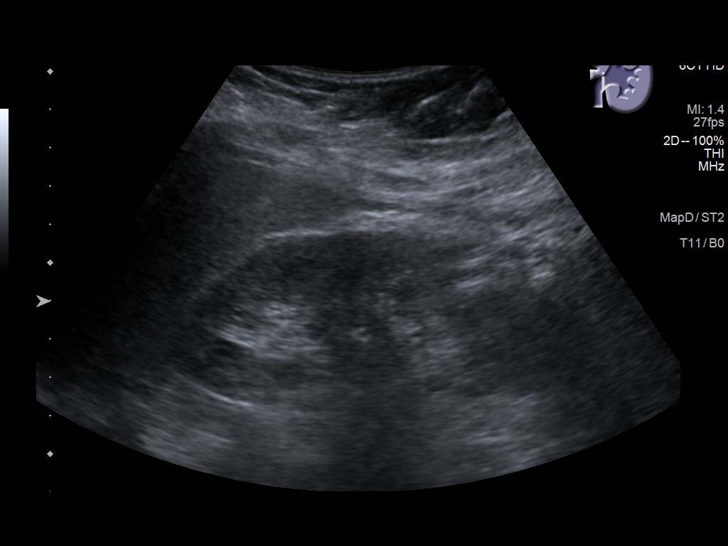
[im 28/45]
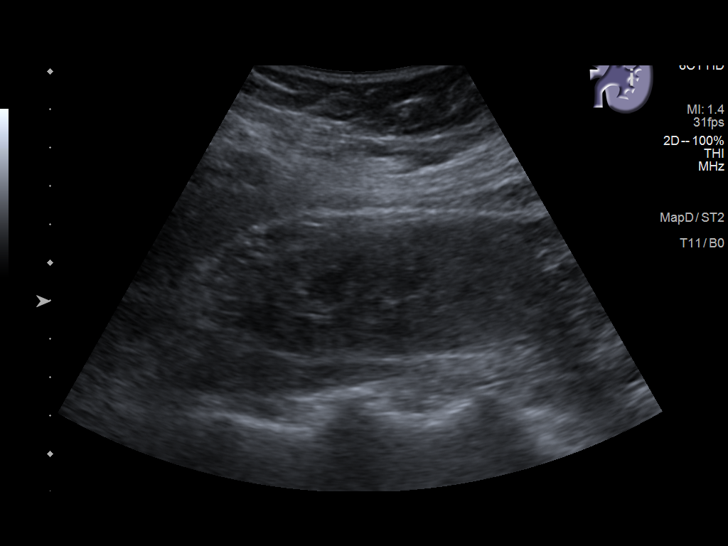
[im 30/45]
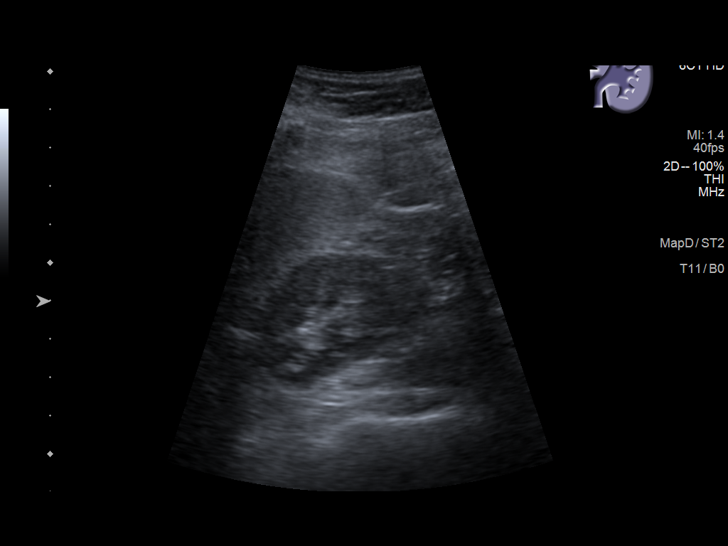
[im 34/45]
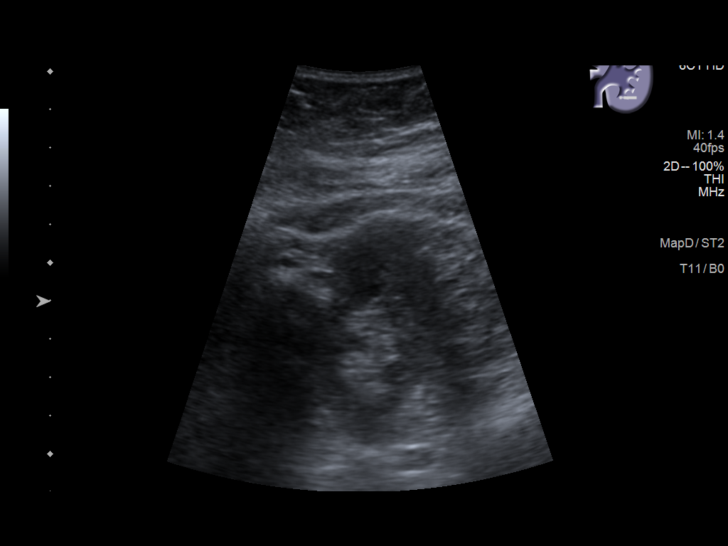
[im 37/45]
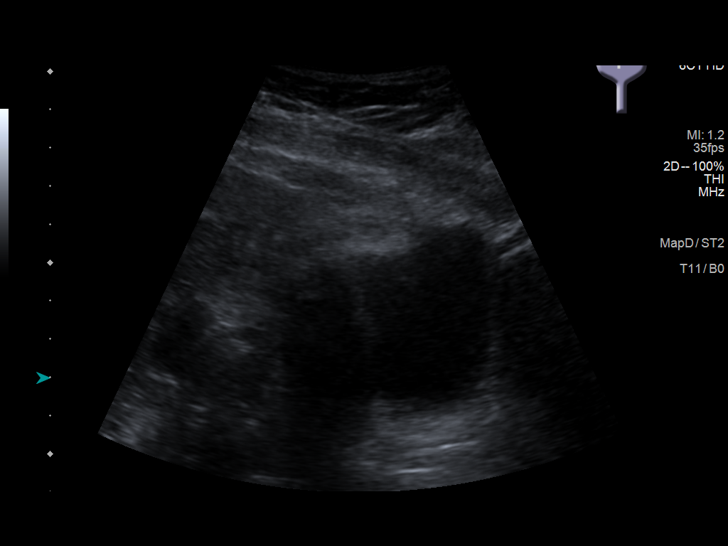
[im 41/45]
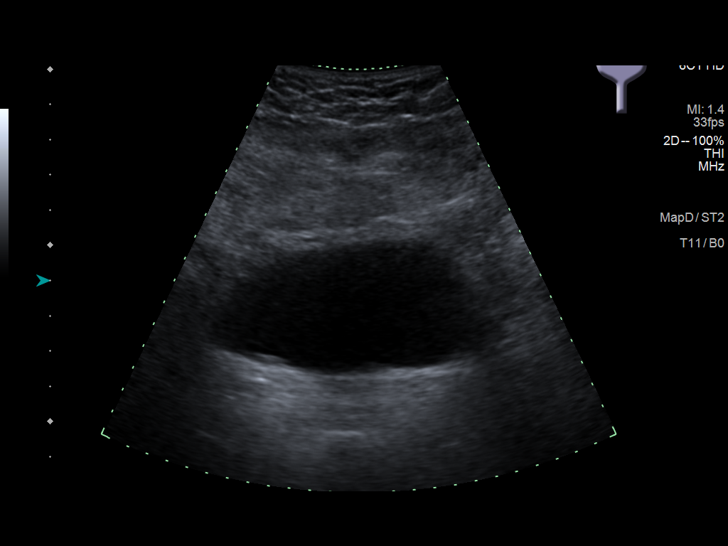
[im 45/45]
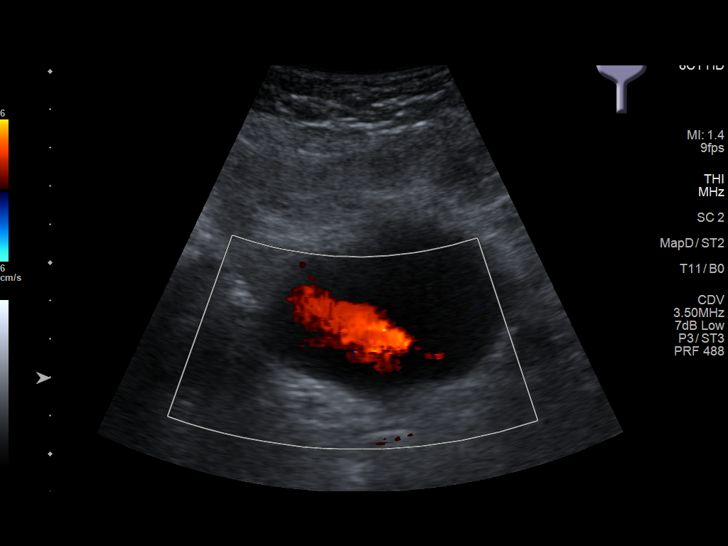

[14 of 25 positions shown; findings below may reference images not displayed]

FINDINGS: Right Kidney:

Renal measurements: 11.0 x 3.9 x 4.8 cm = volume: 106.7 mL. There is
an echogenic lower pole mass measuring 1.6 x 1.5 x 1.6 cm. There is
mild pelvic fullness without frank hydronephrosis.

Left Kidney:

Renal measurements: 11 x 4.1 x 4.4 cm = volume: 102.5 mL.
Echogenicity within normal limits. No mass or hydronephrosis
visualized.

Bladder:

Appears normal for degree of bladder distention. Both ureteral jets
were visualized.
IMPRESSION: 1. A 1.6 cm echogenic mass is noted arising from the lower pole the
right kidney. This corresponds well to the previously noted fat
containing lesion on CT from 0030 and is consistent with an
angiomyolipoma with slight interval growth.
2. No acute sonographic abnormality.  No frank hydronephrosis.

## 2019-10-20 DIAGNOSIS — H40003 Preglaucoma, unspecified, bilateral: Secondary | ICD-10-CM | POA: Diagnosis not present

## 2019-10-26 DIAGNOSIS — H40003 Preglaucoma, unspecified, bilateral: Secondary | ICD-10-CM | POA: Diagnosis not present

## 2020-02-22 DIAGNOSIS — M79671 Pain in right foot: Secondary | ICD-10-CM | POA: Diagnosis not present

## 2020-02-22 DIAGNOSIS — D2372 Other benign neoplasm of skin of left lower limb, including hip: Secondary | ICD-10-CM | POA: Diagnosis not present

## 2020-02-22 DIAGNOSIS — M65871 Other synovitis and tenosynovitis, right ankle and foot: Secondary | ICD-10-CM | POA: Diagnosis not present

## 2020-02-22 DIAGNOSIS — M79672 Pain in left foot: Secondary | ICD-10-CM | POA: Diagnosis not present

## 2020-03-08 DIAGNOSIS — B9689 Other specified bacterial agents as the cause of diseases classified elsewhere: Secondary | ICD-10-CM | POA: Diagnosis not present

## 2020-03-08 DIAGNOSIS — Z20822 Contact with and (suspected) exposure to covid-19: Secondary | ICD-10-CM | POA: Diagnosis not present

## 2020-03-08 DIAGNOSIS — J019 Acute sinusitis, unspecified: Secondary | ICD-10-CM | POA: Diagnosis not present

## 2020-03-08 DIAGNOSIS — U071 COVID-19: Secondary | ICD-10-CM | POA: Diagnosis not present

## 2020-04-14 DIAGNOSIS — L308 Other specified dermatitis: Secondary | ICD-10-CM | POA: Diagnosis not present

## 2020-04-14 DIAGNOSIS — D485 Neoplasm of uncertain behavior of skin: Secondary | ICD-10-CM | POA: Diagnosis not present

## 2020-04-14 DIAGNOSIS — L570 Actinic keratosis: Secondary | ICD-10-CM | POA: Diagnosis not present

## 2020-04-25 DIAGNOSIS — H40003 Preglaucoma, unspecified, bilateral: Secondary | ICD-10-CM | POA: Diagnosis not present

## 2020-06-08 ENCOUNTER — Ambulatory Visit (INDEPENDENT_AMBULATORY_CARE_PROVIDER_SITE_OTHER): Payer: PPO | Admitting: Internal Medicine

## 2020-06-08 ENCOUNTER — Encounter: Payer: Self-pay | Admitting: Internal Medicine

## 2020-06-08 ENCOUNTER — Other Ambulatory Visit: Payer: Self-pay

## 2020-06-08 DIAGNOSIS — Z131 Encounter for screening for diabetes mellitus: Secondary | ICD-10-CM

## 2020-06-08 DIAGNOSIS — E7800 Pure hypercholesterolemia, unspecified: Secondary | ICD-10-CM

## 2020-06-08 DIAGNOSIS — Z1329 Encounter for screening for other suspected endocrine disorder: Secondary | ICD-10-CM

## 2020-06-08 DIAGNOSIS — Z1239 Encounter for other screening for malignant neoplasm of breast: Secondary | ICD-10-CM | POA: Diagnosis not present

## 2020-06-08 DIAGNOSIS — M6530 Trigger finger, unspecified finger: Secondary | ICD-10-CM | POA: Insufficient documentation

## 2020-06-08 DIAGNOSIS — Z1211 Encounter for screening for malignant neoplasm of colon: Secondary | ICD-10-CM | POA: Diagnosis not present

## 2020-06-08 DIAGNOSIS — M654 Radial styloid tenosynovitis [de Quervain]: Secondary | ICD-10-CM | POA: Insufficient documentation

## 2020-06-08 DIAGNOSIS — R3129 Other microscopic hematuria: Secondary | ICD-10-CM | POA: Insufficient documentation

## 2020-06-08 NOTE — Progress Notes (Signed)
There were no vitals taken for this visit.   Subjective:    Patient ID: Morgan Oneill, female    DOB: [birth year], 80 y.o.   MRN: [id number]  HPI: Morgan Oneill is a 80 y.o. female  I connected with KIMIYA BRUNELLE on 06/08/20 by a video enabled telemedicine application and verified that I am speaking with the correct person using two identifiers.  I discussed the limitations of evaluation and management by telemedicine. The patient expressed understanding and agreed to proceed. "I discussed the limitations of evaluation and management by telemedicine and the availability of in person appointments. The patient expressed understanding and agreed to proceed"     This visit was completed via telephone due to the restrictions of the COVID-19 pandemic. All issues as above were discussed and addressed but no physical exam was performed. If it was felt that the patient should be evaluated in the office, they were directed there. The patient verbally consented to this visit. Patient was unable to complete an audio/visual visit due to Technical difficulties. Due to the catastrophic nature of the COVID-19 pandemic, this visit was done through audio contact only. Location of the patient: home Location of the provider: home Those involved with this call:  Provider: Charlynne Cousins, MD CMA: Yvonna Alanis, [place] Desk/Registration: Levert Feinstein  Time spent on call: 10 minutes with patient face to face via video conference. More than 50% of this time was spent in counseling and coordination of care. 10 minutes total spent in review of patient's record and preparation of their chart.     Chief Complaint  Patient presents with  . New Patient (Initial Visit)    Patient would like to have lab work done      Relevant past medical, surgical, family and social history reviewed and updated as indicated. Interim medical history since our last visit reviewed. Allergies and medications reviewed  and updated.  Review of Systems  Constitutional: Negative for activity change, appetite change, chills, fatigue and fever.  HENT: Negative for congestion.   Eyes: Negative for visual disturbance.  Respiratory: Negative for apnea, cough, chest tightness, shortness of breath and wheezing.   Cardiovascular: Negative for chest pain, palpitations and leg swelling.  Gastrointestinal: Negative for abdominal distention, abdominal pain, diarrhea and nausea.  Endocrine: Negative for cold intolerance, heat intolerance, polydipsia, polyphagia and polyuria.  Genitourinary: Negative for difficulty urinating, frequency, hematuria and urgency.  Musculoskeletal: Negative for arthralgias, back pain, gait problem and myalgias.  Skin: Negative for color change and rash.  Neurological: Negative for dizziness, speech difficulty, weakness, light-headedness, numbness and headaches.  Psychiatric/Behavioral: Negative for behavioral problems and confusion. The patient is not nervous/anxious.     Per HPI unless specifically indicated above     Objective:    There were no vitals taken for this visit.  Wt Readings from Last 3 Encounters:  06/16/19 144 lb 3.2 oz (65.4 kg)  05/11/18 150 lb (68 kg)  05/09/17 147 lb 6.4 oz (66.9 kg)    Physical Exam Vitals reviewed: not performed sec to virtual visit.     Results for orders placed or performed in visit on 06/16/19  CBC with Differential/Platelet  Result Value Ref Range   WBC 6.8 3.4 - 10.8 x10E3/uL   RBC 4.44 3.77 - 5.28 x10E6/uL   Hemoglobin 13.2 11.1 - 15.9 g/dL   Hematocrit 39.8 34.0 - 46.6 %   MCV 90 79 - 97 fL   MCH 29.7 26.6 - 33.0 pg  MCHC 33.2 31.5 - 35.7 g/dL   RDW 13.0 11.7 - 15.4 %   Platelets 183 150 - 450 x10E3/uL   Neutrophils 59 Not Estab. %   Lymphs 25 Not Estab. %   Monocytes 9 Not Estab. %   Eos 6 Not Estab. %   Basos 1 Not Estab. %   Neutrophils Absolute 4.0 1.4 - 7.0 x10E3/uL   Lymphocytes Absolute 1.7 0.7 - 3.1 x10E3/uL    Monocytes Absolute 0.6 0.1 - 0.9 x10E3/uL   EOS (ABSOLUTE) 0.4 0.0 - 0.4 x10E3/uL   Basophils Absolute 0.1 0.0 - 0.2 x10E3/uL   Immature Granulocytes 0 Not Estab. %   Immature Grans (Abs) 0.0 0.0 - 0.1 x10E3/uL  Comprehensive metabolic panel  Result Value Ref Range   Glucose 85 65 - 99 mg/dL   BUN 19 8 - 27 mg/dL   Creatinine, Ser 0.91 0.57 - 1.00 mg/dL   GFR calc non Af Amer 61 >59 mL/min/1.73   GFR calc Af Amer 71 >59 mL/min/1.73   BUN/Creatinine Ratio 21 12 - 28   Sodium 143 134 - 144 mmol/L   Potassium 4.3 3.5 - 5.2 mmol/L   Chloride 103 96 - 106 mmol/L   CO2 24 20 - 29 mmol/L   Calcium 9.1 8.7 - 10.3 mg/dL   Total Protein 6.7 6.0 - 8.5 g/dL   Albumin 4.5 3.7 - 4.7 g/dL   Globulin, Total 2.2 1.5 - 4.5 g/dL   Albumin/Globulin Ratio 2.0 1.2 - 2.2   Bilirubin Total 0.5 0.0 - 1.2 mg/dL   Alkaline Phosphatase 82 48 - 121 IU/L   AST 29 0 - 40 IU/L   ALT 19 0 - 32 IU/L  Lipid panel  Result Value Ref Range   Cholesterol, Total 206 (H) 100 - 199 mg/dL   Triglycerides 74 0 - 149 mg/dL   HDL 57 >39 mg/dL   VLDL Cholesterol Cal 13 5 - 40 mg/dL   LDL Chol Calc (NIH) 136 (H) 0 - 99 mg/dL   Chol/HDL Ratio 3.6 0.0 - 4.4 ratio        Current Outpatient Medications:  .  ibuprofen (ADVIL) 200 MG tablet, Take 200 mg by mouth every 6 (six) hours as needed., Disp: , Rfl:     Assessment & Plan:  1. Healht Maintenence : Mammogram: 4 yrs ago. Paps smear: per her last pcp said  DEXA: osteopenia in the past had one 5-6 yrs ago. DECLINES A Cscope : 2 cscopes in the past had polyps in the past - brother had colon cancer.  FU WITH ME IN 2-3 WEEKS TO [facility].  Problem List Items Addressed This Visit   None      Follow up plan: No follow-ups on file.

## 2020-06-19 ENCOUNTER — Encounter: Payer: Self-pay | Admitting: Physician Assistant

## 2020-06-20 DIAGNOSIS — H40003 Preglaucoma, unspecified, bilateral: Secondary | ICD-10-CM | POA: Diagnosis not present

## 2020-06-20 DIAGNOSIS — H2511 Age-related nuclear cataract, right eye: Secondary | ICD-10-CM | POA: Diagnosis not present

## 2020-07-20 ENCOUNTER — Other Ambulatory Visit: Payer: Self-pay

## 2020-07-20 ENCOUNTER — Encounter: Payer: Self-pay | Admitting: Ophthalmology

## 2020-08-01 NOTE — Discharge Instructions (Signed)

## 2020-08-02 ENCOUNTER — Other Ambulatory Visit: Payer: Self-pay

## 2020-08-02 ENCOUNTER — Ambulatory Visit: Payer: PPO | Admitting: Anesthesiology

## 2020-08-02 ENCOUNTER — Encounter
Admission: RE | Disposition: A | Discharge status: Home / Self Care | Payer: Self-pay | Source: Home / Self Care | Attending: Ophthalmology

## 2020-08-02 ENCOUNTER — Ambulatory Visit
Admission: RE | Admit: 2020-08-02 | Discharge: 2020-08-02 | Disposition: A | Discharge status: Home / Self Care | Payer: PPO | Attending: Ophthalmology | Admitting: Ophthalmology

## 2020-08-02 ENCOUNTER — Encounter: Payer: Self-pay | Admitting: Ophthalmology

## 2020-08-02 DIAGNOSIS — Z801 Family history of malignant neoplasm of trachea, bronchus and lung: Secondary | ICD-10-CM | POA: Diagnosis not present

## 2020-08-02 DIAGNOSIS — Z791 Long term (current) use of non-steroidal anti-inflammatories (NSAID): Secondary | ICD-10-CM | POA: Insufficient documentation

## 2020-08-02 DIAGNOSIS — H2511 Age-related nuclear cataract, right eye: Secondary | ICD-10-CM | POA: Insufficient documentation

## 2020-08-02 DIAGNOSIS — H25811 Combined forms of age-related cataract, right eye: Secondary | ICD-10-CM | POA: Diagnosis not present

## 2020-08-02 DIAGNOSIS — Z800 Family history of malignant neoplasm of digestive organs: Secondary | ICD-10-CM | POA: Insufficient documentation

## 2020-08-02 HISTORY — PX: CATARACT EXTRACTION W/PHACO: SHX586

## 2020-08-02 SURGERY — PHACOEMULSIFICATION, CATARACT, WITH IOL INSERTION
Anesthesia: Monitor Anesthesia Care | Site: Eye | Laterality: Right

## 2020-08-02 MED ORDER — SIGHTPATH DOSE#1 BSS IO SOLN
INTRAOCULAR | Status: DC | PRN
  Administered 2020-08-02: 82 mL via OPHTHALMIC

## 2020-08-02 MED ORDER — CYCLOPENTOLATE HCL 2 % OP SOLN
1.0000 [drp] | OPHTHALMIC | Status: DC | PRN
  Administered 2020-08-02 (×3): 1 [drp] via OPHTHALMIC

## 2020-08-02 MED ORDER — SIGHTPATH DOSE#1 BSS IO SOLN
INTRAOCULAR | Status: DC | PRN
  Administered 2020-08-02: 15 mL

## 2020-08-02 MED ORDER — MIDAZOLAM HCL 2 MG/2ML IJ SOLN
INTRAMUSCULAR | Status: DC | PRN
  Administered 2020-08-02: 1 mg via INTRAVENOUS

## 2020-08-02 MED ORDER — FENTANYL CITRATE (PF) 100 MCG/2ML IJ SOLN
INTRAMUSCULAR | Status: DC | PRN
  Administered 2020-08-02: 50 ug via INTRAVENOUS

## 2020-08-02 MED ORDER — PHENYLEPHRINE HCL 10 % OP SOLN
1.0000 [drp] | OPHTHALMIC | Status: DC | PRN
  Administered 2020-08-02 (×3): 1 [drp] via OPHTHALMIC

## 2020-08-02 MED ORDER — SIGHTPATH DOSE#1 NA HYALUR & NA CHOND-NA HYALUR IO KIT
PACK | INTRAOCULAR | Status: DC | PRN
  Administered 2020-08-02: 1 mL via OPHTHALMIC

## 2020-08-02 MED ORDER — SIGHTPATH DOSE#1 BSS IO SOLN
INTRAOCULAR | Status: DC | PRN
  Administered 2020-08-02: 1 mL

## 2020-08-02 MED ORDER — CEFUROXIME OPHTHALMIC INJECTION 1 MG/0.1 ML
INJECTION | OPHTHALMIC | Status: DC | PRN
  Administered 2020-08-02: 0.1 mL via INTRACAMERAL

## 2020-08-02 MED ORDER — TETRACAINE HCL 0.5 % OP SOLN
1.0000 [drp] | OPHTHALMIC | Status: DC | PRN
  Administered 2020-08-02 (×3): 1 [drp] via OPHTHALMIC

## 2020-08-02 MED ORDER — BRIMONIDINE TARTRATE-TIMOLOL 0.2-0.5 % OP SOLN
OPHTHALMIC | Status: DC | PRN
  Administered 2020-08-02: 1 [drp] via OPHTHALMIC

## 2020-08-02 SURGICAL SUPPLY — 22 items
CANNULA ANT/CHMB 27GA (MISCELLANEOUS) ×2 IMPLANT
GLOVE SURG ENC TEXT LTX SZ7.5 (GLOVE) ×4 IMPLANT
GLOVE SURG GAMMEX PI TX LF 7.5 (GLOVE) IMPLANT
GLOVE SURG TRIUMPH 8.0 PF LTX (GLOVE) ×2 IMPLANT
GOWN STRL REUS W/ TWL LRG LVL3 (GOWN DISPOSABLE) ×2 IMPLANT
GOWN STRL REUS W/TWL LRG LVL3 (GOWN DISPOSABLE) ×4
LENS IOL TECNIS EYHANCE 21.0 (Intraocular Lens) ×2 IMPLANT
MARKER SKIN DUAL TIP RULER LAB (MISCELLANEOUS) ×2 IMPLANT
NDL RETROBULBAR .5 NSTRL (NEEDLE) IMPLANT
NEEDLE CAPSULORHEX 25GA (NEEDLE) ×2 IMPLANT
NEEDLE FILTER BLUNT 18X 1/2SAF (NEEDLE) ×2
NEEDLE FILTER BLUNT 18X1 1/2 (NEEDLE) ×2 IMPLANT
PACK EYE AFTER SURG (MISCELLANEOUS) ×2 IMPLANT
RING MALYGIN 7.0 (MISCELLANEOUS) IMPLANT
SUT ETHILON 10-0 CS-B-6CS-B-6 (SUTURE)
SUT VICRYL  9 0 (SUTURE)
SUT VICRYL 9 0 (SUTURE) IMPLANT
SUTURE EHLN 10-0 CS-B-6CS-B-6 (SUTURE) IMPLANT
SYR 3ML LL SCALE MARK (SYRINGE) ×4 IMPLANT
SYR TB 1ML LUER SLIP (SYRINGE) ×2 IMPLANT
WATER STERILE IRR 250ML POUR (IV SOLUTION) ×2 IMPLANT
WIPE NON LINTING 3.25X3.25 (MISCELLANEOUS) ×2 IMPLANT

## 2020-08-02 NOTE — Anesthesia Procedure Notes (Signed)
Procedure Name: MAC Date/Time: 08/02/2020 9:19 AM Performed by: Cameron Ali, CRNA Pre-anesthesia Checklist: Patient identified, Emergency Drugs available, Suction available, Timeout performed and Patient being monitored Patient Re-evaluated:Patient Re-evaluated prior to induction Oxygen Delivery Method: Nasal cannula Placement Confirmation: positive ETCO2

## 2020-08-02 NOTE — Anesthesia Preprocedure Evaluation (Signed)
Anesthesia Evaluation  Patient identified by MRN, date of birth, ID band Patient awake    History of Anesthesia Complications Negative for: history of anesthetic complications  Airway Mallampati: II  TM Distance: >3 FB Neck ROM: Full    Dental no notable dental hx.    Pulmonary neg pulmonary ROS,    Pulmonary exam normal        Cardiovascular negative cardio ROS Normal cardiovascular exam     Neuro/Psych    GI/Hepatic negative GI ROS, Neg liver ROS,   Endo/Other  negative endocrine ROS  Renal/GU negative Renal ROS     Musculoskeletal negative musculoskeletal ROS (+)   Abdominal   Peds negative pediatric ROS (+)  Hematology negative hematology ROS (+)   Anesthesia Other Findings   Reproductive/Obstetrics                             Anesthesia Physical Anesthesia Plan  ASA: 1  Anesthesia Plan: MAC   Post-op Pain Management:    Induction: Intravenous  PONV Risk Score and Plan: 2 and TIVA, Midazolam and Treatment may vary due to age or medical condition  Airway Management Planned: Nasal Cannula and Natural Airway  Additional Equipment: None  Intra-op Plan:   Post-operative Plan:   Informed Consent: I have reviewed the patients History and Physical, chart, labs and discussed the procedure including the risks, benefits and alternatives for the proposed anesthesia with the patient or authorized representative who has indicated his/her understanding and acceptance.       Plan Discussed with: CRNA  Anesthesia Plan Comments:         Anesthesia Quick Evaluation

## 2020-08-02 NOTE — Op Note (Signed)
  LOCATION:  [facility]   PREOPERATIVE DIAGNOSIS:    Nuclear sclerotic cataract right eye. H25.11   POSTOPERATIVE DIAGNOSIS:  Nuclear sclerotic cataract right eye.     PROCEDURE:  Phacoemusification with posterior chamber intraocular lens placement of the right eye   ULTRASOUND TIME: Procedure(s) with comments: CATARACT EXTRACTION PHACO AND INTRAOCULAR LENS PLACEMENT (IOC) RIGHT (Right) - 8.70 1:15.4  LENS:   Implant Name Type Inv. Item Serial No. Manufacturer Lot No. LRB No. Used Action  LENS IOL TECNIS EYHANCE 21.0 - [id number] Intraocular Lens LENS IOL TECNIS EYHANCE 21.0 [id number] JOHNSON   Right 1 Implanted         SURGEON:  Wyonia Hough, MD   ANESTHESIA:  Topical with tetracaine drops and 2% Xylocaine jelly, augmented with 1% preservative-free intracameral lidocaine.    COMPLICATIONS:  None.   DESCRIPTION OF PROCEDURE:  The patient was identified in the holding room and transported to the operating room and placed in the supine position under the operating microscope.  The right eye was identified as the operative eye and it was prepped and draped in the usual sterile ophthalmic fashion.   A 1 millimeter clear-corneal paracentesis was made at the 12:00 position.  0.5 ml of preservative-free 1% lidocaine was injected into the anterior chamber. The anterior chamber was filled with Viscoat viscoelastic.  A 2.4 millimeter keratome was used to make a near-clear corneal incision at the 9:00 position.  A curvilinear capsulorrhexis was made with a cystotome and capsulorrhexis forceps.  Balanced salt solution was used to hydrodissect and hydrodelineate the nucleus.   Phacoemulsification was then used in stop and chop fashion to remove the lens nucleus and epinucleus.  The remaining cortex was then removed using the irrigation and aspiration handpiece. Provisc was then placed into the capsular bag to distend it for lens placement.  A lens was then injected into the  capsular bag.  The remaining viscoelastic was aspirated.   Wounds were hydrated with balanced salt solution.  The anterior chamber was inflated to a physiologic pressure with balanced salt solution.  No wound leaks were noted. Cefuroxime 0.1 ml of a 10mg /ml solution was injected into the anterior chamber for a dose of 1 mg of intracameral antibiotic at the completion of the case.   Timolol and Brimonidine drops were applied to the eye.  The patient was taken to the recovery room in stable condition without complications of anesthesia or surgery.   Rosana Farnell 08/02/2020, 9:33 AM

## 2020-08-02 NOTE — Transfer of Care (Signed)
Immediate Anesthesia Transfer of Care Note  Patient: Morgan Oneill  Procedure(s) Performed: CATARACT EXTRACTION PHACO AND INTRAOCULAR LENS PLACEMENT (IOC) RIGHT (Right: Eye)  Patient Location: PACU  Anesthesia Type: MAC  Level of Consciousness: awake, alert  and patient cooperative  Airway and Oxygen Therapy: Patient Spontanous Breathing and Patient connected to supplemental oxygen  Post-op Assessment: Post-op Vital signs reviewed, Patient's Cardiovascular Status Stable, Respiratory Function Stable, Patent Airway and No signs of Nausea or vomiting  Post-op Vital Signs: Reviewed and stable  Complications: No notable events documented.

## 2020-08-02 NOTE — Anesthesia Postprocedure Evaluation (Signed)
Anesthesia Post Note  Patient: Morgan Oneill  Procedure(s) Performed: CATARACT EXTRACTION PHACO AND INTRAOCULAR LENS PLACEMENT (IOC) RIGHT (Right: Eye)     Patient location during evaluation: PACU Anesthesia Type: MAC Level of consciousness: awake and alert Pain management: pain level controlled Vital Signs Assessment: post-procedure vital signs reviewed and stable Respiratory status: spontaneous breathing, nonlabored ventilation, respiratory function stable and patient connected to nasal cannula oxygen Cardiovascular status: stable and blood pressure returned to baseline Postop Assessment: no apparent nausea or vomiting Anesthetic complications: no   No notable events documented.  Adele Barthel Hakiem Malizia

## 2020-08-02 NOTE — H&P (Signed)
  [facility]   Primary Care Physician:  Pcp, No Ophthalmologist: Dr. Leandrew Koyanagi  Pre-Procedure History & Physical: HPI:  Morgan Oneill is a 80 y.o. female here for ophthalmic surgery.   Past Medical History:  Diagnosis Date   Hematuria    Hyperlipidemia    Osteopenia    Skin cancer     Past Surgical History:  Procedure Laterality Date   ABDOMINOPLASTY     BUNIONECTOMY Bilateral 1980   COLONOSCOPY     COLONOSCOPY WITH PROPOFOL N/A 06/21/2015   Procedure: COLONOSCOPY WITH PROPOFOL;  Surgeon: Manya Silvas, MD;  Location: [facility] ENDOSCOPY;  Service: Endoscopy;  Laterality: N/A;   FRACTURE SURGERY Left 2009    Prior to Admission medications   Medication Sig Start Date End Date Taking? Authorizing Provider  ibuprofen (ADVIL) 200 MG tablet Take 200 mg by mouth every 6 (six) hours as needed.   Yes [provider]    Allergies as of 06/21/2020   (No Known Allergies)    Family History  Problem Relation Age of Onset   Cancer Father        lung ; smoker and alcoholic   Colon cancer Brother     Social History   Socioeconomic History   Marital status: Married    Spouse name: Not on file   Number of children: 1   Years of education: Not on file   Highest education level: GED or equivalent  Occupational History   Occupation: alterations    Comment: full time  Tobacco Use   Smoking status: Never   Smokeless tobacco: Never  Vaping Use   Vaping Use: Never used  Substance and Sexual Activity   Alcohol use: No   Drug use: No   Sexual activity: Not on file  Other Topics Concern   Not on file  Social History Narrative   Not on file   Social Determinants of Health   Financial Resource Strain: Not on file  Food Insecurity: Not on file  Transportation Needs: Not on file  Physical Activity: Not on file  Stress: Not on file  Social Connections: Not on file  Intimate Partner Violence: Not on file    Review of Systems: See HPI, otherwise  negative ROS  Physical Exam: BP 137/71   Pulse 73   Temp 97.9 F (36.6 C) (Temporal)   Resp 16   Ht 5\' 7"  (1.702 m)   Wt 67.6 kg   SpO2 99%   BMI 23.34 kg/m  General:   Alert,  pleasant and cooperative in NAD Head:  Normocephalic and atraumatic. Lungs:  Clear to auscultation.    Heart:  Regular rate and rhythm.   Impression/Plan: Morgan Oneill is here for ophthalmic surgery.  Risks, benefits, limitations, and alternatives regarding ophthalmic surgery have been reviewed with the patient.  Questions have been answered.  All parties agreeable.   Leandrew Koyanagi, MD  08/02/2020, 8:11 AM

## 2020-08-03 ENCOUNTER — Encounter: Payer: Self-pay | Admitting: Ophthalmology

## 2020-12-26 DIAGNOSIS — Z85828 Personal history of other malignant neoplasm of skin: Secondary | ICD-10-CM | POA: Diagnosis not present

## 2020-12-26 DIAGNOSIS — L820 Inflamed seborrheic keratosis: Secondary | ICD-10-CM | POA: Diagnosis not present

## 2020-12-26 DIAGNOSIS — D2261 Melanocytic nevi of right upper limb, including shoulder: Secondary | ICD-10-CM | POA: Diagnosis not present

## 2020-12-26 DIAGNOSIS — D2339 Other benign neoplasm of skin of other parts of face: Secondary | ICD-10-CM | POA: Diagnosis not present

## 2020-12-26 DIAGNOSIS — D485 Neoplasm of uncertain behavior of skin: Secondary | ICD-10-CM | POA: Diagnosis not present

## 2020-12-26 DIAGNOSIS — D2272 Melanocytic nevi of left lower limb, including hip: Secondary | ICD-10-CM | POA: Diagnosis not present

## 2020-12-26 DIAGNOSIS — L538 Other specified erythematous conditions: Secondary | ICD-10-CM | POA: Diagnosis not present

## 2020-12-26 DIAGNOSIS — D2262 Melanocytic nevi of left upper limb, including shoulder: Secondary | ICD-10-CM | POA: Diagnosis not present

## 2021-02-08 DIAGNOSIS — H903 Sensorineural hearing loss, bilateral: Secondary | ICD-10-CM | POA: Diagnosis not present

## 2021-03-05 DIAGNOSIS — Z03818 Encounter for observation for suspected exposure to other biological agents ruled out: Secondary | ICD-10-CM | POA: Diagnosis not present

## 2021-03-05 DIAGNOSIS — U071 COVID-19: Secondary | ICD-10-CM | POA: Diagnosis not present

## 2021-03-30 DIAGNOSIS — H40003 Preglaucoma, unspecified, bilateral: Secondary | ICD-10-CM | POA: Diagnosis not present

## 2021-04-01 DIAGNOSIS — R059 Cough, unspecified: Secondary | ICD-10-CM | POA: Diagnosis not present

## 2021-04-01 DIAGNOSIS — Z20822 Contact with and (suspected) exposure to covid-19: Secondary | ICD-10-CM | POA: Diagnosis not present

## 2021-04-01 DIAGNOSIS — J069 Acute upper respiratory infection, unspecified: Secondary | ICD-10-CM | POA: Diagnosis not present

## 2021-04-03 DIAGNOSIS — H40053 Ocular hypertension, bilateral: Secondary | ICD-10-CM | POA: Diagnosis not present

## 2021-04-04 DIAGNOSIS — K5900 Constipation, unspecified: Secondary | ICD-10-CM | POA: Diagnosis not present

## 2021-04-04 DIAGNOSIS — Z800 Family history of malignant neoplasm of digestive organs: Secondary | ICD-10-CM | POA: Diagnosis not present

## 2021-10-09 DIAGNOSIS — H40053 Ocular hypertension, bilateral: Secondary | ICD-10-CM | POA: Diagnosis not present

## 2021-11-24 DIAGNOSIS — M25561 Pain in right knee: Secondary | ICD-10-CM | POA: Diagnosis not present

## 2021-11-24 DIAGNOSIS — J069 Acute upper respiratory infection, unspecified: Secondary | ICD-10-CM | POA: Diagnosis not present

## 2021-11-24 DIAGNOSIS — Z03818 Encounter for observation for suspected exposure to other biological agents ruled out: Secondary | ICD-10-CM | POA: Diagnosis not present

## 2021-11-28 DIAGNOSIS — M25561 Pain in right knee: Secondary | ICD-10-CM | POA: Diagnosis not present

## 2021-11-28 DIAGNOSIS — M2341 Loose body in knee, right knee: Secondary | ICD-10-CM | POA: Diagnosis not present

## 2021-11-28 DIAGNOSIS — M76891 Other specified enthesopathies of right lower limb, excluding foot: Secondary | ICD-10-CM | POA: Diagnosis not present

## 2021-11-28 DIAGNOSIS — M1711 Unilateral primary osteoarthritis, right knee: Secondary | ICD-10-CM | POA: Diagnosis not present

## 2022-05-14 DIAGNOSIS — H40003 Preglaucoma, unspecified, bilateral: Secondary | ICD-10-CM | POA: Diagnosis not present

## 2022-05-14 DIAGNOSIS — Z961 Presence of intraocular lens: Secondary | ICD-10-CM | POA: Diagnosis not present

## 2022-05-14 DIAGNOSIS — H2512 Age-related nuclear cataract, left eye: Secondary | ICD-10-CM | POA: Diagnosis not present

## 2022-07-15 ENCOUNTER — Telehealth: Payer: Self-pay | Admitting: Family Medicine

## 2022-07-16 ENCOUNTER — Ambulatory Visit: Payer: PPO | Admitting: Family Medicine

## 2022-08-16 ENCOUNTER — Ambulatory Visit: Payer: PPO | Admitting: Family Medicine

## 2022-12-18 DIAGNOSIS — Z860100 Personal history of colon polyps, unspecified: Secondary | ICD-10-CM | POA: Diagnosis not present

## 2022-12-18 DIAGNOSIS — Z131 Encounter for screening for diabetes mellitus: Secondary | ICD-10-CM | POA: Diagnosis not present

## 2022-12-18 DIAGNOSIS — N644 Mastodynia: Secondary | ICD-10-CM | POA: Diagnosis not present

## 2022-12-18 DIAGNOSIS — E7800 Pure hypercholesterolemia, unspecified: Secondary | ICD-10-CM | POA: Diagnosis not present

## 2022-12-18 DIAGNOSIS — M810 Age-related osteoporosis without current pathological fracture: Secondary | ICD-10-CM | POA: Diagnosis not present

## 2022-12-20 ENCOUNTER — Other Ambulatory Visit: Payer: Self-pay | Admitting: Family Medicine

## 2022-12-20 DIAGNOSIS — N644 Mastodynia: Secondary | ICD-10-CM

## 2022-12-29 DIAGNOSIS — Z20822 Contact with and (suspected) exposure to covid-19: Secondary | ICD-10-CM | POA: Diagnosis not present

## 2022-12-29 DIAGNOSIS — U071 COVID-19: Secondary | ICD-10-CM | POA: Diagnosis not present

## 2023-01-01 ENCOUNTER — Other Ambulatory Visit: Payer: Self-pay | Admitting: Family Medicine

## 2023-01-01 ENCOUNTER — Ambulatory Visit
Admission: RE | Admit: 2023-01-01 | Discharge: 2023-01-01 | Disposition: A | Discharge status: Home / Self Care | Payer: PPO | Source: Ambulatory Visit | Attending: Family Medicine | Admitting: Family Medicine

## 2023-01-01 DIAGNOSIS — N644 Mastodynia: Secondary | ICD-10-CM

## 2023-01-01 DIAGNOSIS — R92323 Mammographic fibroglandular density, bilateral breasts: Secondary | ICD-10-CM | POA: Diagnosis not present

## 2023-02-03 ENCOUNTER — Ambulatory Visit: Payer: PPO | Admitting: Nurse Practitioner

## 2023-05-15 DIAGNOSIS — H40003 Preglaucoma, unspecified, bilateral: Secondary | ICD-10-CM | POA: Diagnosis not present

## 2023-05-20 DIAGNOSIS — H2512 Age-related nuclear cataract, left eye: Secondary | ICD-10-CM | POA: Diagnosis not present

## 2023-05-20 DIAGNOSIS — Z961 Presence of intraocular lens: Secondary | ICD-10-CM | POA: Diagnosis not present

## 2023-05-20 DIAGNOSIS — H40003 Preglaucoma, unspecified, bilateral: Secondary | ICD-10-CM | POA: Diagnosis not present

## 2023-05-20 DIAGNOSIS — H43813 Vitreous degeneration, bilateral: Secondary | ICD-10-CM | POA: Diagnosis not present

## 2023-06-01 DIAGNOSIS — B9689 Other specified bacterial agents as the cause of diseases classified elsewhere: Secondary | ICD-10-CM | POA: Diagnosis not present

## 2023-06-01 DIAGNOSIS — J019 Acute sinusitis, unspecified: Secondary | ICD-10-CM | POA: Diagnosis not present

## 2023-06-01 DIAGNOSIS — Z03818 Encounter for observation for suspected exposure to other biological agents ruled out: Secondary | ICD-10-CM | POA: Diagnosis not present

## 2023-06-01 DIAGNOSIS — J209 Acute bronchitis, unspecified: Secondary | ICD-10-CM | POA: Diagnosis not present

## 2023-12-19 DIAGNOSIS — L538 Other specified erythematous conditions: Secondary | ICD-10-CM | POA: Diagnosis not present

## 2023-12-19 DIAGNOSIS — D485 Neoplasm of uncertain behavior of skin: Secondary | ICD-10-CM | POA: Diagnosis not present

## 2023-12-19 DIAGNOSIS — L820 Inflamed seborrheic keratosis: Secondary | ICD-10-CM | POA: Diagnosis not present

## 2023-12-24 ENCOUNTER — Other Ambulatory Visit: Payer: Self-pay | Admitting: Family Medicine

## 2023-12-24 DIAGNOSIS — M810 Age-related osteoporosis without current pathological fracture: Secondary | ICD-10-CM | POA: Diagnosis not present

## 2023-12-24 DIAGNOSIS — Z139 Encounter for screening, unspecified: Secondary | ICD-10-CM | POA: Diagnosis not present

## 2023-12-24 DIAGNOSIS — Z1331 Encounter for screening for depression: Secondary | ICD-10-CM | POA: Diagnosis not present

## 2023-12-24 DIAGNOSIS — Z0000 Encounter for general adult medical examination without abnormal findings: Secondary | ICD-10-CM | POA: Diagnosis not present

## 2023-12-24 DIAGNOSIS — Z131 Encounter for screening for diabetes mellitus: Secondary | ICD-10-CM | POA: Diagnosis not present

## 2023-12-24 DIAGNOSIS — Z130 Encounter for screening for diseases of the blood and blood-forming organs and certain disorders involving the immune mechanism: Secondary | ICD-10-CM | POA: Diagnosis not present

## 2023-12-24 DIAGNOSIS — E7800 Pure hypercholesterolemia, unspecified: Secondary | ICD-10-CM | POA: Diagnosis not present

## 2023-12-24 DIAGNOSIS — Z1231 Encounter for screening mammogram for malignant neoplasm of breast: Secondary | ICD-10-CM

## 2023-12-24 DIAGNOSIS — Z9181 History of falling: Secondary | ICD-10-CM | POA: Diagnosis not present

## 2023-12-24 DIAGNOSIS — Z860100 Personal history of colon polyps, unspecified: Secondary | ICD-10-CM | POA: Diagnosis not present
